# Patient Record
Sex: Female | Born: 1960 | Race: Black or African American | Hispanic: No | Marital: Single | Smoking: Current some day smoker
Health system: Southern US, Community
[De-identification: ages and names within clinical notes are randomized; demographics above are authoritative.]

## PROBLEM LIST (undated history)

## (undated) DIAGNOSIS — G8929 Other chronic pain: Secondary | ICD-10-CM

## (undated) DIAGNOSIS — Z8711 Personal history of peptic ulcer disease: Secondary | ICD-10-CM

## (undated) DIAGNOSIS — M545 Low back pain, unspecified: Secondary | ICD-10-CM

## (undated) DIAGNOSIS — K219 Gastro-esophageal reflux disease without esophagitis: Secondary | ICD-10-CM

## (undated) HISTORY — DX: Low back pain, unspecified: M54.50

## (undated) HISTORY — DX: Other chronic pain: G89.29

## (undated) HISTORY — DX: Gastro-esophageal reflux disease without esophagitis: K21.9

## (undated) HISTORY — PX: KNEE ARTHROSCOPY: SUR90

---

## 2020-01-16 ENCOUNTER — Other Ambulatory Visit: Payer: Self-pay

## 2020-01-16 ENCOUNTER — Ambulatory Visit
Admission: RE | Admit: 2020-01-16 | Discharge: 2020-01-16 | Disposition: A | Discharge status: Home / Self Care | Payer: No Typology Code available for payment source | Source: Ambulatory Visit | Attending: Physical Medicine and Rehabilitation | Admitting: Physical Medicine and Rehabilitation

## 2020-01-16 ENCOUNTER — Other Ambulatory Visit: Payer: Self-pay | Admitting: Physical Medicine and Rehabilitation

## 2020-01-16 DIAGNOSIS — M25562 Pain in left knee: Secondary | ICD-10-CM

## 2020-01-16 DIAGNOSIS — M25561 Pain in right knee: Secondary | ICD-10-CM

## 2020-02-11 ENCOUNTER — Other Ambulatory Visit: Payer: Self-pay

## 2020-02-11 ENCOUNTER — Ambulatory Visit (INDEPENDENT_AMBULATORY_CARE_PROVIDER_SITE_OTHER): Payer: Self-pay | Admitting: Orthopaedic Surgery

## 2020-02-11 DIAGNOSIS — G8929 Other chronic pain: Secondary | ICD-10-CM

## 2020-02-11 DIAGNOSIS — M25562 Pain in left knee: Secondary | ICD-10-CM

## 2020-02-11 DIAGNOSIS — M25561 Pain in right knee: Secondary | ICD-10-CM

## 2020-02-11 NOTE — Progress Notes (Signed)
      Office Visit Note   Patient: Tasha Fox           Date of Birth: 03-29-1961           MRN: [id number] Visit Date: 02/11/2020              Requested by: No referring provider defined for this encounter. PCP: Patient, No Pcp Per   Assessment & Plan: Visit Diagnoses:  1. Chronic pain of right knee   2. Chronic pain of left knee     Plan: I am concerned about the mechanical symptoms she is having on the right knee and feeling what ever ligamentous structure is popping across the medial femoral condyle and the posterior medial aspect of her right knee.  Based on my clinical exam a MRI is warranted to rule out a ligamentous disruption or tear of the right knee.  She agrees with this treatment plan.  I will see her back once we have the MRI of her right knee.  Follow-Up Instructions: Return in about 2 weeks (around 02/25/2020).   Orders:  No orders of the defined types were placed in this encounter.  No orders of the defined types were placed in this encounter.     Procedures: No procedures performed   Clinical Data: No additional findings.   Subjective: Chief Complaint  Patient presents with  . Left Knee - Pain  . Right Knee - Pain  The patient comes in today as a referral from Dr. Nickola Major to evaluate and treat bilateral knee pain.  The patient has been having knee pain for years and a lot of popping in her right knee.  She has had steroid injections and PRP treatment.  X-rays have been recently obtained last month of her knees.  She is very thin individual.  She has never had surgery on her knees and never injured them from what she can recall.  She points to her right knee as the main source of her pain and she shows me where there is some type of ligamentous structure popping over the medial femoral condyle and the posterior medial aspect of her knee.    HPI  Review of Systems She currently denies any headache, chest pain, shortness of breath, fever, chills,  nausea, vomiting  Objective: Vital Signs: There were no vitals taken for this visit.  Physical Exam She is alert and orient x3 and in no acute distress Ortho Exam Examination of both knees shows no effusion.  Both knees are aligned anatomically.  Her right knee does show some type of ligamentous or tendinous structure that is popping over the posterior medial aspect of the knee itself.  I can see this when I flex and extend her knee. Specialty Comments:  No specialty comments available.  Imaging: No results found. 3 views of both knees are independently reviewed on the canopy system.  There is patellofemoral arthritic changes but otherwise the medial lateral compartments are well-maintained and the alignment is neutral.  PMFS History: There are no problems to display for this patient.  No past medical history on file.  No family history on file.   Social History   Occupational History  . Not on file  Tobacco Use  . Smoking status: Not on file  Substance and Sexual Activity  . Alcohol use: Not on file  . Drug use: Not on file  . Sexual activity: Not on file

## 2020-02-26 ENCOUNTER — Ambulatory Visit: Payer: Self-pay | Admitting: Orthopaedic Surgery

## 2020-03-06 ENCOUNTER — Other Ambulatory Visit: Payer: Self-pay

## 2020-03-06 ENCOUNTER — Ambulatory Visit
Admission: RE | Admit: 2020-03-06 | Discharge: 2020-03-06 | Disposition: A | Discharge status: Home / Self Care | Payer: Managed Care, Other (non HMO) | Source: Ambulatory Visit | Attending: Orthopaedic Surgery | Admitting: Orthopaedic Surgery

## 2020-03-06 DIAGNOSIS — G8929 Other chronic pain: Secondary | ICD-10-CM

## 2020-03-07 ENCOUNTER — Other Ambulatory Visit: Payer: Self-pay

## 2020-03-25 ENCOUNTER — Ambulatory Visit: Payer: Managed Care, Other (non HMO) | Admitting: Orthopaedic Surgery

## 2020-03-25 ENCOUNTER — Telehealth: Payer: Self-pay

## 2020-03-25 NOTE — Telephone Encounter (Signed)
Can you call her?

## 2020-03-25 NOTE — Telephone Encounter (Signed)
Patient had appt with Dr Magnus Ivan today but was cancelled due to Dr Magnus Ivan not being in office. Patient would like to be called with MRI results.

## 2020-04-01 ENCOUNTER — Encounter: Payer: Self-pay | Admitting: Physician Assistant

## 2020-04-01 ENCOUNTER — Ambulatory Visit (INDEPENDENT_AMBULATORY_CARE_PROVIDER_SITE_OTHER): Payer: Managed Care, Other (non HMO) | Admitting: Physician Assistant

## 2020-04-01 ENCOUNTER — Other Ambulatory Visit: Payer: Self-pay

## 2020-04-01 DIAGNOSIS — M67461 Ganglion, right knee: Secondary | ICD-10-CM

## 2020-04-01 NOTE — Progress Notes (Signed)
HPI: Mrs. Durkin returns today to go over the MRI of her right knee.  She continues to have pain in the right knee mostly the medial aspect of the knee.  She continues to have popping over the medial aspect of the knee near the femoral condyle. MRI images are reviewed with the patient.  MRI dated 03/08/2020 showed no meniscal or ligamentous injury.  Tricompartmental mild arthritic changes.  Large ganglion cyst originating post anterior to the PCL tibial attachment extending medially to the posterior joint line.  The ganglion cyst was measured at 2.4 x 4.3 x 3.0 cm.  Physical exam: Right knee: Good range of motion of the knee.  Tenderness along medial joint line.  Slight fullness right knee over the medial joint line compared to the medial joint line of the left knee.    Impression: Ganglion cyst right knee Mild tricompartmental arthritic changes  Plan: Due to the fact the patient's failed conservative treatment is having pain along the medial joint line which is consistent with the MRI findings of a large ganglion cyst recommend right knee arthroscopy with debridement and open excision of ganglion cyst.  Questions were encouraged and answered at length by Dr. Magnus Ivan myself.  Risk benefits surgery discussed.  Postop protocol discussed with patient.  She will follow-up with [place] 1 week postop.

## 2020-04-10 ENCOUNTER — Telehealth: Payer: Self-pay | Admitting: Orthopaedic Surgery

## 2020-04-10 NOTE — Telephone Encounter (Signed)
Tasha Fox with [employer] called stating the pt is waiting for approval on two cyst removal surgeries and one has been approved but the other requires a peer to peer with [employer]. The case number for [employer] is: [id number]  [phone] Opt. 4  Tasha Fox would like for [place] to notify the pt when everything has been approved.  [facility] CB# [phone]

## 2020-04-10 NOTE — Telephone Encounter (Signed)
Spoke with patient and rescheduled surgery

## 2020-04-10 NOTE — Telephone Encounter (Signed)
Please advise 

## 2020-04-14 NOTE — Telephone Encounter (Signed)
See message from Dr. Magnus Ivan.

## 2020-04-14 NOTE — Telephone Encounter (Signed)
I did the peer-to-peer review. Approved.  CPT code 17510 for scope and [phone] for cyst excision. Approval code# [id number]

## 2020-04-23 ENCOUNTER — Inpatient Hospital Stay: Payer: Managed Care, Other (non HMO) | Admitting: Physician Assistant

## 2020-04-30 ENCOUNTER — Other Ambulatory Visit: Payer: Self-pay | Admitting: Orthopaedic Surgery

## 2020-04-30 DIAGNOSIS — M67461 Ganglion, right knee: Secondary | ICD-10-CM

## 2020-04-30 DIAGNOSIS — M94261 Chondromalacia, right knee: Secondary | ICD-10-CM

## 2020-04-30 MED ORDER — HYDROCODONE-ACETAMINOPHEN 5-325 MG PO TABS: 1.0000 | ORAL_TABLET | Freq: Four times a day (QID) | ORAL | 0 refills | Status: DC | PRN

## 2020-05-07 ENCOUNTER — Encounter: Payer: Self-pay | Admitting: Orthopaedic Surgery

## 2020-05-07 ENCOUNTER — Ambulatory Visit (INDEPENDENT_AMBULATORY_CARE_PROVIDER_SITE_OTHER): Payer: Managed Care, Other (non HMO) | Admitting: Orthopaedic Surgery

## 2020-05-07 DIAGNOSIS — Z9889 Other specified postprocedural states: Secondary | ICD-10-CM

## 2020-05-07 DIAGNOSIS — M67461 Ganglion, right knee: Secondary | ICD-10-CM

## 2020-05-07 DIAGNOSIS — M1711 Unilateral primary osteoarthritis, right knee: Secondary | ICD-10-CM | POA: Insufficient documentation

## 2020-05-07 MED ORDER — OXYCODONE HCL 5 MG PO TABS: 5.0000 mg | ORAL_TABLET | Freq: Four times a day (QID) | ORAL | 0 refills | Status: DC | PRN

## 2020-05-07 NOTE — Progress Notes (Signed)
The patient is 1 week out from a right knee arthroscopy with debridement as well as excision of a parameniscal cyst and ganglion cyst of the posterior-medial aspect of the right knee.  She is having a lot of pain from the surgery.  At the time of surgery I showed her the arthroscopy pictures and showed him again today.  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  The meniscus were intact and the ACL was intact.  There was a large cyst on the posterior medial aspect of the knee that we were able to remove through an open excision and close on the knee joint.  On exam I did remove all the sutures in place Steri-Strips.  There is not a large knee joint effusion.  Her calf is soft.  She did report some shortness of breath recently but she is breathing well today in the office.  She is a candidate for hyaluronic acid for this knee.  I will send in normal pain medicine prescription and will see her back in hopefully a month to hopefully place hyaluronic acid into the left knee to treat the pain from her osteoarthritis.

## 2020-05-08 ENCOUNTER — Telehealth: Payer: Self-pay

## 2020-05-08 NOTE — Telephone Encounter (Signed)
Submitted VOB, Monovisc, right knee. 

## 2020-05-08 NOTE — Telephone Encounter (Signed)
Right knee gel injection  

## 2020-05-08 NOTE — Telephone Encounter (Signed)
Noted  

## 2020-05-11 ENCOUNTER — Telehealth: Payer: Self-pay

## 2020-05-11 NOTE — Telephone Encounter (Signed)
PA required for Monovisc, right knee. Faxed completed PA form to [employer] at [phone]. 

## 2020-05-13 ENCOUNTER — Telehealth: Payer: Self-pay

## 2020-05-13 NOTE — Telephone Encounter (Signed)
Patient is aware that she is approved for gel injection.  Approved, Monovisc, right knee. Buy & Bill Patient will be responsible for 20% OOP. No Co-pay PA required PA Approval# [id number] Valid 05/11/2020- 06/01/2020  Appt. 05/28/2020

## 2020-05-28 ENCOUNTER — Encounter: Payer: Self-pay | Admitting: Orthopaedic Surgery

## 2020-05-28 ENCOUNTER — Ambulatory Visit (INDEPENDENT_AMBULATORY_CARE_PROVIDER_SITE_OTHER): Payer: Managed Care, Other (non HMO) | Admitting: Orthopaedic Surgery

## 2020-05-28 DIAGNOSIS — G8929 Other chronic pain: Secondary | ICD-10-CM

## 2020-05-28 DIAGNOSIS — M25562 Pain in left knee: Secondary | ICD-10-CM | POA: Diagnosis not present

## 2020-05-28 DIAGNOSIS — M1711 Unilateral primary osteoarthritis, right knee: Secondary | ICD-10-CM | POA: Diagnosis not present

## 2020-05-28 DIAGNOSIS — M25561 Pain in right knee: Secondary | ICD-10-CM

## 2020-05-28 MED ORDER — LIDOCAINE HCL 1 % IJ SOLN
3.0000 mL | INTRAMUSCULAR | Status: AC | PRN
  Administered 2020-05-28: 3 mL

## 2020-05-28 MED ORDER — HYALURONAN 88 MG/4ML IX SOSY
88.0000 mg | PREFILLED_SYRINGE | INTRA_ARTICULAR | Status: AC | PRN
  Administered 2020-05-28: 88 mg via INTRA_ARTICULAR

## 2020-05-28 MED ORDER — METHYLPREDNISOLONE ACETATE 40 MG/ML IJ SUSP
40.0000 mg | INTRAMUSCULAR | Status: AC | PRN
  Administered 2020-05-28: 40 mg via INTRA_ARTICULAR

## 2020-05-28 NOTE — Progress Notes (Signed)
   Procedure Note  Patient: Tasha Fox             Date of Birth: 1961-08-04           MRN: [id number]             Visit Date: 05/28/2020  Procedures: Visit Diagnoses:  1. Chronic pain of right knee   2. Unilateral primary osteoarthritis, right knee   3. Chronic pain of left knee     Large Joint Inj: L knee on 05/28/2020 2:04 PM Indications: diagnostic evaluation and pain Details: 22 G 1.5 in needle, superolateral approach  Arthrogram: No  Medications: 3 mL lidocaine 1 %; 40 mg methylPREDNISolone acetate 40 MG/ML Outcome: tolerated well, no immediate complications Procedure, treatment alternatives, risks and benefits explained, specific risks discussed. Consent was given by the patient. Immediately prior to procedure a time out was called to verify the correct patient, procedure, equipment, support staff and site/side marked as required. Patient was prepped and draped in the usual sterile fashion.   Large Joint Inj: R knee on 05/28/2020 2:05 PM Indications: diagnostic evaluation and pain Details: 22 G 1.5 in needle, superolateral approach  Arthrogram: No  Medications: 3 mL lidocaine 1 %; 88 mg Hyaluronan 88 MG/4ML Outcome: tolerated well, no immediate complications Procedure, treatment alternatives, risks and benefits explained, specific risks discussed. Consent was given by the patient. Immediately prior to procedure a time out was called to verify the correct patient, procedure, equipment, support staff and site/side marked as required. Patient was prepped and draped in the usual sterile fashion.    The patient is 1 month out from a right knee arthroscopy.  She has been having now some left knee pain due to compensation for her right knee.  The arthroscopy pictures were reviewed again today of her right knee and it does show significant chondromalacia of the patellofemoral joint and the medial compartment of the knee.  This is at least grade 3 to centimeters grade IV  chondromalacia.  Today she is scheduled for a Monovisc injection with hyaluronic acid into the right knee.  This is to hopefully temporize her symptoms.  The only other option is knee replacement surgery.  She does wish to have a steroid injection in her left knee today.  I explained the rationale behind the steroid injection for the left knee as well as the hyaluronic acid for the right knee.  She tolerated both of these well.  All questions and concerns were answered addressed.  We will keep her out of work the next 2 weeks and allow her to return to work September 20.  I would like to see her back in 8 weeks to see how she is doing overall.

## 2020-06-04 ENCOUNTER — Ambulatory Visit: Payer: Managed Care, Other (non HMO) | Admitting: Orthopaedic Surgery

## 2020-07-28 ENCOUNTER — Ambulatory Visit: Payer: Managed Care, Other (non HMO) | Admitting: Orthopaedic Surgery

## 2020-07-30 ENCOUNTER — Ambulatory Visit (INDEPENDENT_AMBULATORY_CARE_PROVIDER_SITE_OTHER): Payer: Managed Care, Other (non HMO) | Admitting: Orthopaedic Surgery

## 2020-07-30 ENCOUNTER — Encounter: Payer: Self-pay | Admitting: Orthopaedic Surgery

## 2020-07-30 DIAGNOSIS — M25561 Pain in right knee: Secondary | ICD-10-CM | POA: Diagnosis not present

## 2020-07-30 DIAGNOSIS — M25562 Pain in left knee: Secondary | ICD-10-CM

## 2020-07-30 DIAGNOSIS — G8929 Other chronic pain: Secondary | ICD-10-CM

## 2020-07-30 DIAGNOSIS — M1711 Unilateral primary osteoarthritis, right knee: Secondary | ICD-10-CM | POA: Diagnosis not present

## 2020-07-30 NOTE — Progress Notes (Signed)
The patient comes in today reporting much improvement in terms of her right knee pain.  This is a knee that we performed an arthroscopic intervention on several months ago and a cyst excision.  8 weeks ago we did place hyaluronic acid in the right knee and steroid in her left knee.  She says right now she is doing better overall and has some tightness but she is not taking medication for pain and she feels like she is made good progress and is doing great.  Examination of her right knee shows no effusion.  Range of motion is full.  There is only some mild medial joint line tenderness which is minimal.  The knee is ligamentously stable.  The left knee exam is normal.  At this point she can follow-up as needed since she is doing well.  We can always reinject her knees at a later date if they become painful again or problematic to her.  Have also recommended occasional Aleve or Tylenol arthritis and even Voltaren gel to try.  All questions and concerns were answered and addressed.

## 2021-01-29 ENCOUNTER — Other Ambulatory Visit (HOSPITAL_BASED_OUTPATIENT_CLINIC_OR_DEPARTMENT_OTHER): Payer: Self-pay

## 2021-01-29 ENCOUNTER — Encounter (HOSPITAL_BASED_OUTPATIENT_CLINIC_OR_DEPARTMENT_OTHER): Payer: Self-pay | Admitting: Emergency Medicine

## 2021-01-29 ENCOUNTER — Emergency Department (HOSPITAL_BASED_OUTPATIENT_CLINIC_OR_DEPARTMENT_OTHER)
Admission: EM | Admit: 2021-01-29 | Discharge: 2021-01-29 | Disposition: A | Discharge status: Home / Self Care | Payer: Managed Care, Other (non HMO) | Attending: Emergency Medicine | Admitting: Emergency Medicine

## 2021-01-29 ENCOUNTER — Other Ambulatory Visit: Payer: Self-pay

## 2021-01-29 ENCOUNTER — Emergency Department (HOSPITAL_BASED_OUTPATIENT_CLINIC_OR_DEPARTMENT_OTHER): Payer: Managed Care, Other (non HMO)

## 2021-01-29 DIAGNOSIS — Z87891 Personal history of nicotine dependence: Secondary | ICD-10-CM | POA: Insufficient documentation

## 2021-01-29 DIAGNOSIS — K2900 Acute gastritis without bleeding: Secondary | ICD-10-CM | POA: Diagnosis not present

## 2021-01-29 DIAGNOSIS — R112 Nausea with vomiting, unspecified: Secondary | ICD-10-CM

## 2021-01-29 DIAGNOSIS — R197 Diarrhea, unspecified: Secondary | ICD-10-CM | POA: Insufficient documentation

## 2021-01-29 HISTORY — DX: Personal history of peptic ulcer disease: Z87.11

## 2021-01-29 LAB — CBC WITH DIFFERENTIAL/PLATELET
Abs Immature Granulocytes: 0.04 10*3/uL (ref 0.00–0.07)
Basophils Absolute: 0 10*3/uL (ref 0.0–0.1)
Basophils Relative: 0 %
Eosinophils Absolute: 0.1 10*3/uL (ref 0.0–0.5)
Eosinophils Relative: 1 %
HCT: 41.4 % (ref 36.0–46.0)
Hemoglobin: 13.9 g/dL (ref 12.0–15.0)
Immature Granulocytes: 0 %
Lymphocytes Relative: 23 %
Lymphs Abs: 2.7 10*3/uL (ref 0.7–4.0)
MCH: 31.2 pg (ref 26.0–34.0)
MCHC: 33.6 g/dL (ref 30.0–36.0)
MCV: 92.8 fL (ref 80.0–100.0)
Monocytes Absolute: 0.9 10*3/uL (ref 0.1–1.0)
Monocytes Relative: 8 %
Neutro Abs: 7.7 10*3/uL (ref 1.7–7.7)
Neutrophils Relative %: 68 %
Platelets: 331 10*3/uL (ref 150–400)
RBC: 4.46 MIL/uL (ref 3.87–5.11)
RDW: 11.9 % (ref 11.5–15.5)
WBC: 11.5 10*3/uL — ABNORMAL HIGH (ref 4.0–10.5)
nRBC: 0 % (ref 0.0–0.2)

## 2021-01-29 LAB — COMPREHENSIVE METABOLIC PANEL
ALT: 7 U/L (ref 0–44)
AST: 12 U/L — ABNORMAL LOW (ref 15–41)
Albumin: 4.4 g/dL (ref 3.5–5.0)
Alkaline Phosphatase: 86 U/L (ref 38–126)
Anion gap: 12 (ref 5–15)
BUN: 12 mg/dL (ref 6–20)
CO2: 24 mmol/L (ref 22–32)
Calcium: 9.8 mg/dL (ref 8.9–10.3)
Chloride: 100 mmol/L (ref 98–111)
Creatinine, Ser: 0.73 mg/dL (ref 0.44–1.00)
GFR, Estimated: >60 mL/min (ref >60)
Glucose, Bld: 92 mg/dL (ref 70–99)
Potassium: 3.9 mmol/L (ref 3.5–5.1)
Sodium: 136 mmol/L (ref 135–145)
Total Bilirubin: 0.8 mg/dL (ref 0.3–1.2)
Total Protein: 7.7 g/dL (ref 6.5–8.1)

## 2021-01-29 LAB — PROTIME-INR
INR: 1 (ref 0.8–1.2)
Prothrombin Time: 13.2 seconds (ref 11.4–15.2)

## 2021-01-29 LAB — LIPASE, BLOOD: Lipase: 22 U/L (ref 11–51)

## 2021-01-29 MED ORDER — PANTOPRAZOLE SODIUM 20 MG PO TBEC
20.0000 mg | DELAYED_RELEASE_TABLET | Freq: Every day | ORAL | 1 refills | Status: DC
  Filled 2021-01-29: qty 30, 30d supply, fill #0

## 2021-01-29 MED ORDER — ONDANSETRON HCL 4 MG/2ML IJ SOLN
4.0000 mg | Freq: Once | INTRAMUSCULAR | Status: AC
  Administered 2021-01-29: 4 mg via INTRAVENOUS
  Filled 2021-01-29: qty 2

## 2021-01-29 MED ORDER — ONDANSETRON 4 MG PO TBDP
4.0000 mg | ORAL_TABLET | Freq: Three times a day (TID) | ORAL | 0 refills | Status: DC | PRN
  Filled 2021-01-29: qty 20, 7d supply, fill #0

## 2021-01-29 MED ORDER — PANTOPRAZOLE SODIUM 40 MG IV SOLR
40.0000 mg | Freq: Once | INTRAVENOUS | Status: AC
  Administered 2021-01-29: 40 mg via INTRAVENOUS
  Filled 2021-01-29: qty 40

## 2021-01-29 MED ORDER — IOHEXOL 350 MG/ML SOLN
75.0000 mL | Freq: Once | INTRAVENOUS | Status: AC | PRN
  Administered 2021-01-29: 75 mL via INTRAVENOUS

## 2021-01-29 MED ORDER — MORPHINE SULFATE (PF) 4 MG/ML IV SOLN
4.0000 mg | Freq: Once | INTRAVENOUS | Status: AC
Start: 2021-01-29 — End: 2021-01-29
  Administered 2021-01-29: 4 mg via INTRAVENOUS
  Filled 2021-01-29: qty 1

## 2021-01-29 MED ORDER — SODIUM CHLORIDE 0.9 % IV BOLUS
500.0000 mL | Freq: Once | INTRAVENOUS | Status: AC
Start: 2021-01-29 — End: 2021-01-29
  Administered 2021-01-29: 500 mL via INTRAVENOUS

## 2021-01-29 NOTE — Discharge Instructions (Addendum)
You are seen in the emergency department for nausea vomiting diarrhea and abdominal pain.  You had lab work and a CAT scan.  Your symptoms are likely due to some gastritis which is irritation of the stomach.  We are prescribing some acid medication and nausea medication.  Please start with a clear liquid diet and advance as tolerated.  Follow-up with your doctor.  Return to the emergency department for any worsening or concerning symptoms.

## 2021-01-29 NOTE — ED Notes (Signed)
Patient verbalizes understanding of discharge instructions. Opportunity for questioning and answers were provided. Armband removed by staff, pt discharged from ED. Pt. ambulatory and discharged home.  

## 2021-01-29 NOTE — ED Triage Notes (Signed)
Patient reports to the ER for abdominal. Patient reports the pain started on Monday. Patient reports emesis with blood. Endorses diarrhea.

## 2021-01-29 NOTE — ED Provider Notes (Signed)
[facility] EMERGENCY DEPT Provider Note   CSN: [id number] Arrival date & time: 01/29/21  1058     History No chief complaint on file.   Tasha Fox is a 60 y.o. female.  She is here with a complaint of 4 days of upper abdominal pain, nausea vomiting diarrhea.  She said the vomit and diarrhea is brown-colored.  No fevers or chills.  She thinks she had a similar episode a year ago.  She thinks she also has a remote history of stomach ulcers but does not take any medication.  Quit smoking 5 months ago and infrequent alcohol.  The history is provided by the patient and a relative.  Abdominal Pain Pain location:  Epigastric Pain quality: aching   Pain radiates to:  Does not radiate Pain severity:  Moderate Onset quality:  Gradual Duration:  4 days Timing:  Constant Progression:  Unchanged Chronicity:  New Context: not alcohol use and not trauma   Relieved by:  Nothing Worsened by:  Nothing Ineffective treatments:  None tried Associated symptoms: diarrhea, nausea and vomiting   Associated symptoms: no chest pain, no constipation, no cough, no dysuria, no fever, no shortness of breath and no sore throat        Past Medical History:  Diagnosis Date  . History of stomach ulcers     Patient Active Problem List   Diagnosis Date Noted  . Unilateral primary osteoarthritis, right knee 05/07/2020    Past Surgical History:  Procedure Laterality Date  . KNEE ARTHROSCOPY Right      OB History    Gravida      Para      Term      Preterm      AB      Living  2     SAB      IAB      Ectopic      Multiple      Live Births              History reviewed. No pertinent family history.  Social History   Tobacco Use  . Smoking status: Former Smoker    Packs/day: 2.00    Years: 35.00    Pack years: 70.00    Types: Cigarettes  . Smokeless tobacco: Never Used  Vaping Use  . Vaping Use: Never used  Substance Use Topics  . Alcohol use: Not  Currently  . Drug use: Not Currently    Types: Marijuana    Home Medications Prior to Admission medications   Medication Sig Start Date End Date Taking? Authorizing Provider  clindamycin (CLEOCIN T) 1 % external solution clindamycin phosphate 1 % topical solution  APPLY ONE APPLICATION TOPICALLY TWICE DAILY. 07/09/18   [provider]  diazepam (VALIUM) 5 MG tablet diazepam 5 mg tablet  TAKE 1 TO 2 TABLETS BY MOUTH 90 MINUTES BEFORE PROCEDURE MAY REPEAT 2 DOSES 30 MINUTES LATER BUT BEFORE PROCEDURE    [provider]  doxepin (SINEQUAN) 10 MG capsule doxepin 10 mg capsule  TAKE 1 TO 2 CAPSULES BY MOUTH AT BEDTIME    [provider]  doxycycline (VIBRAMYCIN) 100 MG capsule doxycycline hyclate 100 mg capsule  TAKE 1 CAPSULE BY MOUTH TWICE DAILY TAKE WITH WATER 1 HOUR AFTER A MEAL 06/19/18   [provider]  fluconazole (DIFLUCAN) 150 MG tablet fluconazole 150 mg tablet  TAKE 1 TABLET BY MOUTH ONCE A WEEK AS NEEDED FOR YEAST INFECTION    [provider]  gabapentin (NEURONTIN) 300 MG capsule gabapentin 300 mg capsule  TAKE 1 CAPSULE BY MOUTH THREE TIMES DAILY AS DIRECTED FOR 30 DAYS 07/23/18   [provider]  HYDROcodone-acetaminophen (NORCO/VICODIN) 5-325 MG tablet Take 1-2 tablets by mouth every 6 (six) hours as needed for moderate pain. 04/30/20   Kathryne Hitch, MD  ibuprofen (ADVIL) 800 MG tablet ibuprofen 800 mg tablet  TAKE 1 TABLET BY MOUTH EVERY 8 HOURS AS NEEDED FOR PAIN    [provider]  ketoconazole (NIZORAL) 2 % shampoo ketoconazole 2 % shampoo  APPLY 1 APPLICATION TOPICAL 3 TIMES A WEEK 05/18/18   [provider]  ketorolac (TORADOL) 10 MG tablet ketorolac 10 mg tablet  TAKE 1 TABLET BY MOUTH 4 TIMES DAILY    [provider]  meloxicam (MOBIC) 15 MG tablet meloxicam 15 mg tablet  TAKE 1 TABLET BY MOUTH ONCE DAILY AFTER A MEAL FOR 30 DAYS    [provider]  mirtazapine (REMERON)  7.5 MG tablet mirtazapine 7.5 mg tablet  TAKE 1 TABLET BY MOUTH AT BEDTIME TO HELP WITH APPETITE. 07/07/18   [provider]  Omega-3 Fatty Acids (FISH OIL) 1000 MG CAPS Take by mouth.    [provider]  oxyCODONE (ROXICODONE) 5 MG immediate release tablet Take 1 tablet (5 mg total) by mouth every 6 (six) hours as needed for severe pain. 05/07/20   Kathryne Hitch, MD    Allergies    Sulfa antibiotics and Sulfamethoxazole-trimethoprim  Review of Systems   Review of Systems  Constitutional: Negative for fever.  HENT: Negative for sore throat.   Eyes: Negative for visual disturbance.  Respiratory: Negative for cough and shortness of breath.   Cardiovascular: Negative for chest pain.  Gastrointestinal: Positive for abdominal pain, diarrhea, nausea and vomiting. Negative for constipation.  Genitourinary: Negative for dysuria.  Musculoskeletal: Negative for neck pain.  Skin: Negative for rash.  Neurological: Negative for headaches.    Physical Exam Updated Vital Signs BP (!) 163/84   Pulse 80   Temp 98.5 F (36.9 C) (Oral)   Resp 16   LMP  (LMP Unknown)   SpO2 100%   Physical Exam Vitals and nursing note reviewed.  Constitutional:      General: She is not in acute distress.    Appearance: Normal appearance. She is well-developed.  HENT:     Head: Normocephalic and atraumatic.  Eyes:     Conjunctiva/sclera: Conjunctivae normal.  Cardiovascular:     Rate and Rhythm: Normal rate and regular rhythm.     Heart sounds: No murmur heard.   Pulmonary:     Effort: Pulmonary effort is normal. No respiratory distress.     Breath sounds: Normal breath sounds.  Abdominal:     Palpations: Abdomen is soft.     Tenderness: There is no abdominal tenderness. There is no guarding or rebound.  Musculoskeletal:        General: No deformity or signs of injury. Normal range of motion.     Cervical back: Neck supple.  Skin:    General: Skin is warm and dry.   Neurological:     General: No focal deficit present.     Mental Status: She is alert.     ED Results / Procedures / Treatments   Labs (all labs ordered are listed, but only abnormal results are displayed) Labs Reviewed  COMPREHENSIVE METABOLIC PANEL - Abnormal; Notable for the following components:      Result Value   AST  12 (*)    All other components within normal limits  CBC WITH DIFFERENTIAL/PLATELET - Abnormal; Notable for the following components:   WBC 11.5 (*)    All other components within normal limits  LIPASE, BLOOD  PROTIME-INR  URINALYSIS, ROUTINE W REFLEX MICROSCOPIC  OCCULT BLOOD X 1 CARD TO LAB, STOOL    EKG EKG Interpretation  Date/Time:  Friday Jan 29 2021 11:13:47 EDT Ventricular Rate:  78 PR Interval:  141 QRS Duration: 82 QT Interval:  371 QTC Calculation: 423 R Axis:   28 Text Interpretation: Sinus rhythm RSR' in V1 or V2, right VCD or RVH No old tracing to compare Confirmed by Meridee ScoreButler, Jalena Vanderlinden [phone]([id number]) on 01/29/2021 11:18:30 AM   Radiology CT Abdomen Pelvis W Contrast  Result Date: 01/29/2021 CLINICAL DATA:  Abdominal pain. EXAM: CT ABDOMEN AND PELVIS WITH CONTRAST TECHNIQUE: Multidetector CT imaging of the abdomen and pelvis was performed using the standard protocol following bolus administration of intravenous contrast. CONTRAST:  75mL OMNIPAQUE IOHEXOL 350 MG/ML SOLN COMPARISON:  None. FINDINGS: Lower chest: No acute abnormality. Hepatobiliary: 1.0 cm simple cyst in the central hepatic dome. Punctate calcified granuloma along the gallbladder fossa. The gallbladder is unremarkable. No biliary dilatation. Pancreas: Unremarkable. No pancreatic ductal dilatation or surrounding inflammatory changes. Spleen: Normal in size without focal abnormality. Adrenals/Urinary Tract: Adrenal glands are unremarkable. Kidneys are normal, without renal calculi, focal lesion, or hydronephrosis. Bladder is unremarkable. Stomach/Bowel: Asymmetric wall thickening of the gastric  antrum. Appendix appears normal. No evidence of bowel wall thickening, distention, or inflammatory changes. Vascular/Lymphatic: Aortic atherosclerosis. No enlarged abdominal or pelvic lymph nodes. Reproductive: 2.7 cm uterine fibroid.  No adnexal mass. Other: No abdominal wall hernia or abnormality. No abdominopelvic ascites. No pneumoperitoneum. Musculoskeletal: No acute or significant osseous findings. IMPRESSION: 1. Asymmetric wall thickening of the gastric antrum may reflect gastritis. 2. Fibroid uterus. 3. Aortic Atherosclerosis (ICD10-I70.0). Electronically Signed   By: Obie DredgeWilliam T Derry M.D.   On: 01/29/2021 14:49    Procedures Procedures   Medications Ordered in ED Medications  pantoprazole (PROTONIX) injection 40 mg (has no administration in time range)  ondansetron (ZOFRAN) injection 4 mg (has no administration in time range)  sodium chloride 0.9 % bolus 500 mL (has no administration in time range)  morphine 4 MG/ML injection 4 mg (has no administration in time range)    ED Course  I have reviewed the triage vital signs and the nursing notes.  Pertinent labs & imaging results that were available during my care of the patient were reviewed by me and considered in my medical decision making (see chart for details).  Clinical Course as of 01/29/21 1740  Fri Jan 29, 2021  1134 Rectal exam done with nurse Marylene LandAngela as chaperone.  Normal tone no masses no stool in vault.  Sample sent to lab for guaiac. [MB]  1215 The lab says they are rejecting her guaiac card because there was nothing on it. [MB]  1454 Patient states she is hungry.  CT showing some gastric wall thickening.  No other acute findings.  Will prescribe her Zofran and PPI.  Return instructions discussed [MB]    Clinical Course User Index [MB] Terrilee FilesButler, Camari Wisham C, MD   MDM Rules/Calculators/A&P                         This patient complains of upper abdominal pain nausea vomiting diarrhea; this involves an extensive number of  treatment Options and is a complaint that carries with  it a high risk of complications and Morbidity. The differential includes peptic ulcer disease, gastritis, reflux, cholelithiasis, cholecystitis, obstruction, perforation, gastroenteritis  I ordered, reviewed and interpreted labs, which included CBC with mildly elevated white count, normal hemoglobin, chemistries LFTs and lipase normal I ordered medication IV PPI, IV fluids and nausea medication I ordered imaging studies which included CT abdomen and pelvis and I independently    visualized and interpreted imaging which showed gastric wall thickening consideration for gastritis Additional history obtained from patient's daughter Previous records obtained and reviewed in epic, no recent admissions  After the interventions stated above, I reevaluated the patient and found patient symptoms to be improved and she is asking to eat and drink.  Reviewed work-up with her and she is comfortable plan for discharge home on PPI and nausea medication.  Return instructions discussed   Final Clinical Impression(s) / ED Diagnoses Final diagnoses:  Nausea vomiting and diarrhea  Acute gastritis, presence of bleeding unspecified, unspecified gastritis type    Rx / DC Orders ED Discharge Orders         Ordered    pantoprazole (PROTONIX) 20 MG tablet  Daily        01/29/21 1456    ondansetron (ZOFRAN-ODT) 4 MG disintegrating tablet  Every 8 hours PRN        01/29/21 1456           Terrilee Files, MD 01/29/21 1742

## 2021-08-07 IMAGING — CR DG KNEE AP/LAT W/ SUNRISE*L*
3 series · 3 of 3 positions shown · non-contrast
Comparison: None.

CLINICAL DATA: Chronic left knee pain.

EXAM:
LEFT KNEE 3 VIEWS

[w knee ap left]
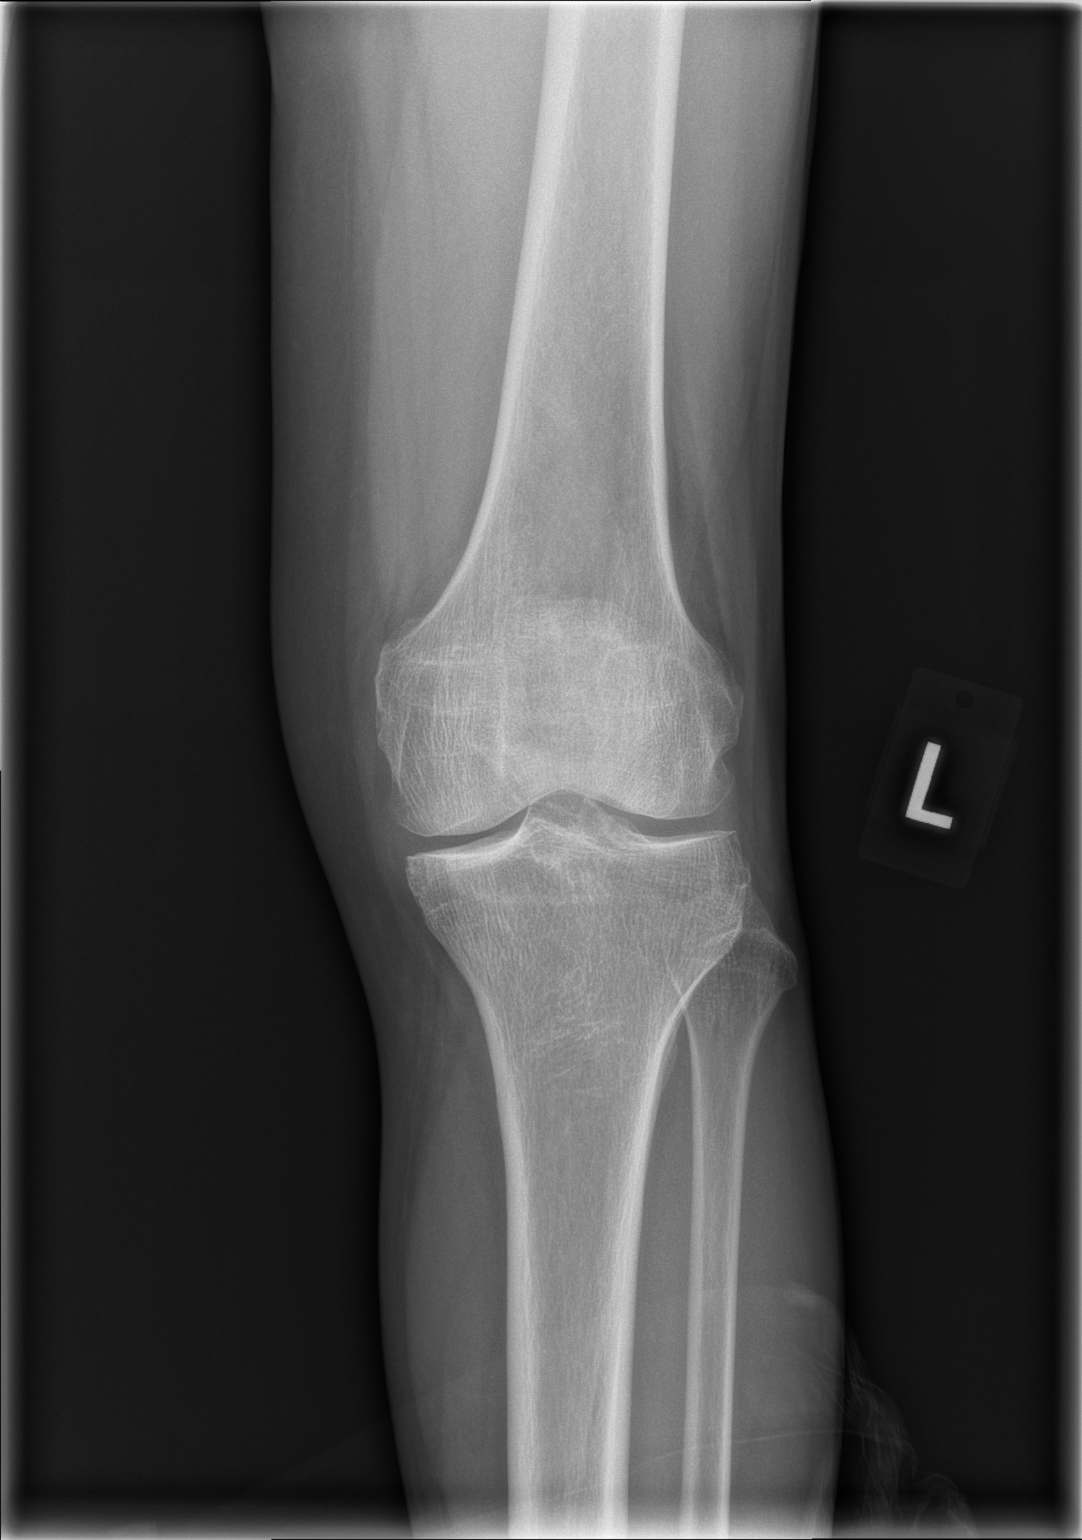

[w knee lat left]
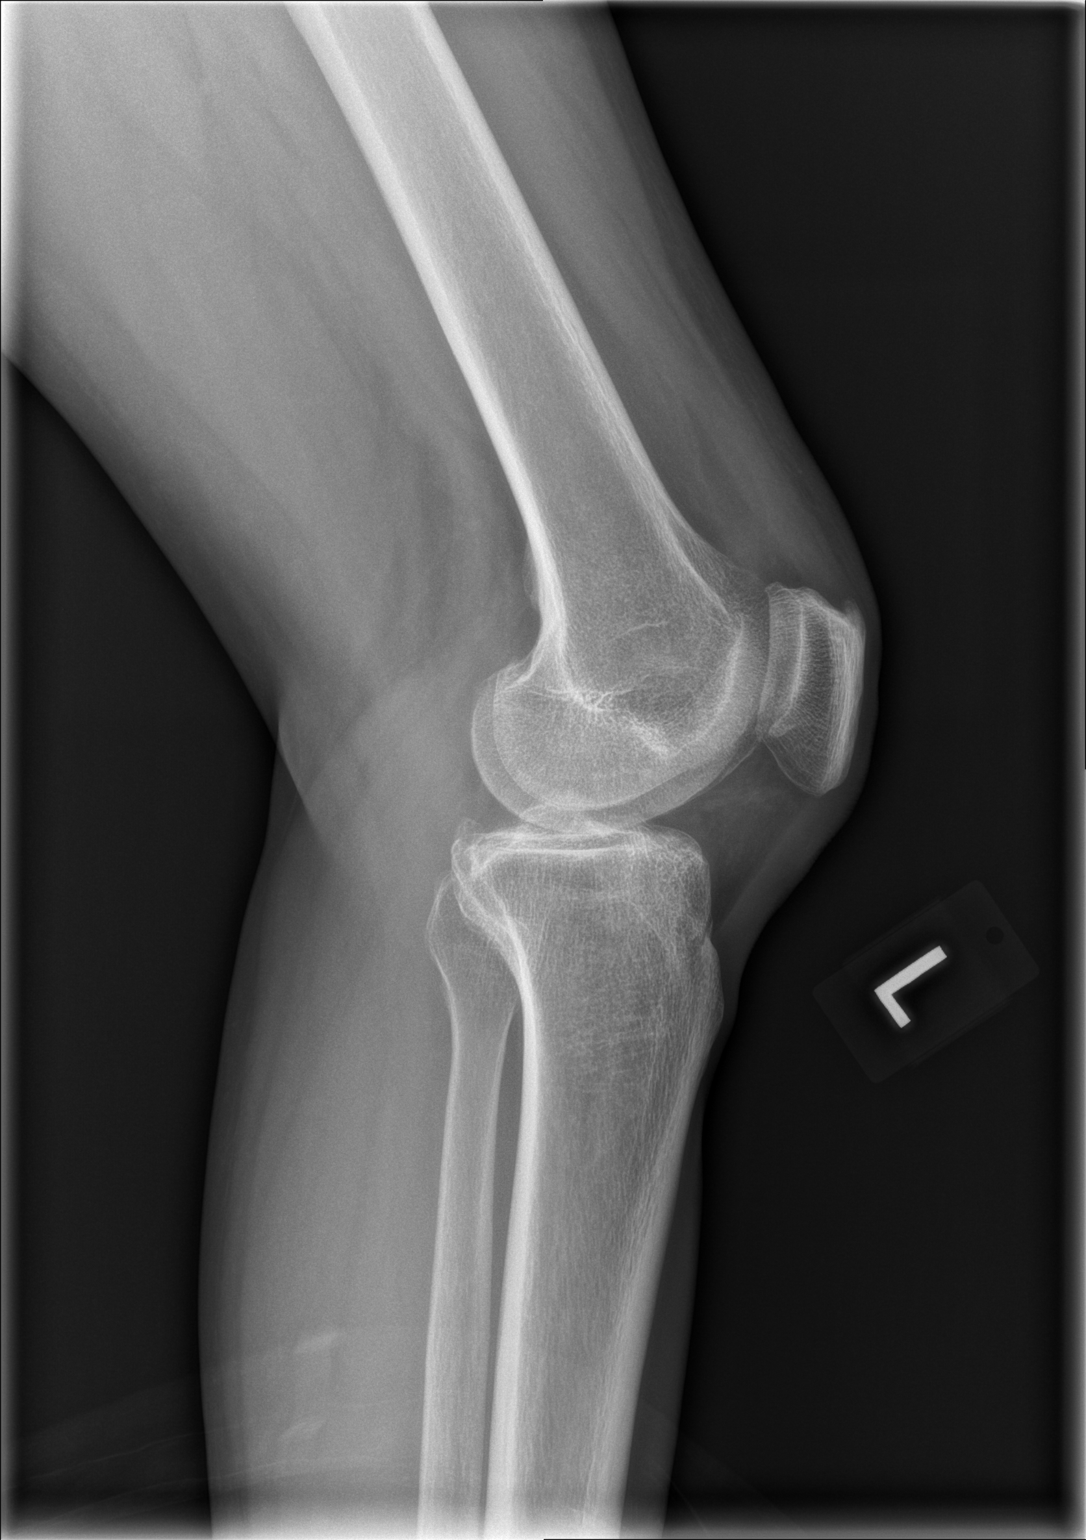

[x knee sunrise left]
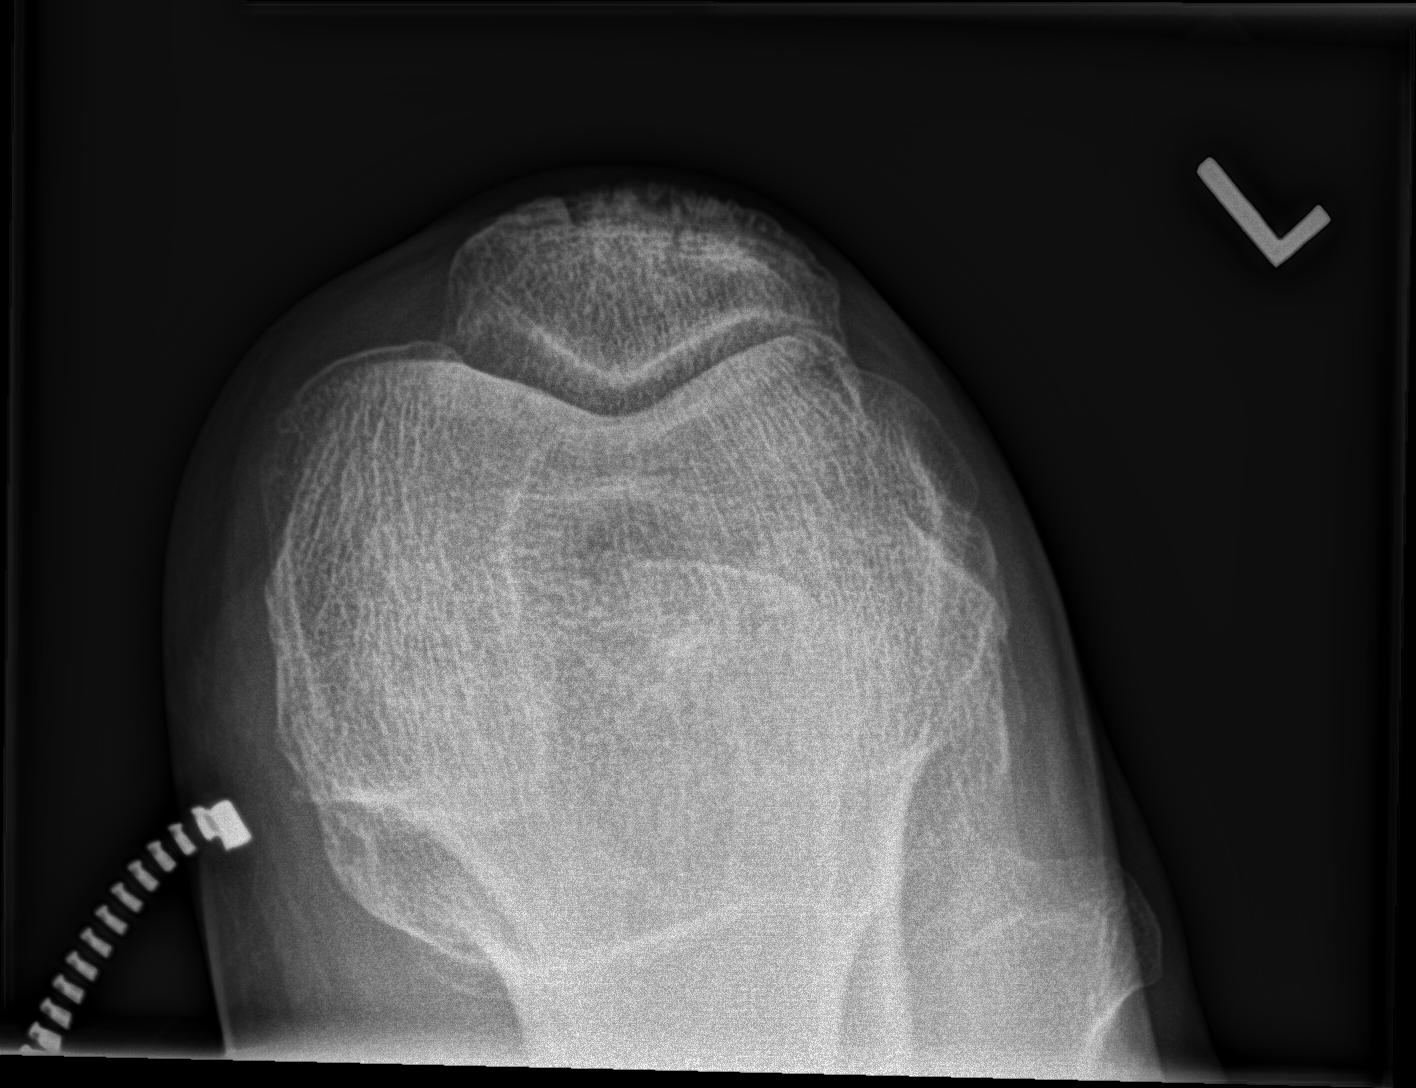

[3 of 3 positions shown; findings below may reference images not displayed]

FINDINGS: No acute fracture or dislocation. No joint effusion. Mild medial and
patellofemoral compartment joint space narrowing. Bone
mineralization is normal. Soft tissues are unremarkable.
IMPRESSION: 1. Mild medial and patellofemoral compartment osteoarthritis.

## 2021-11-08 ENCOUNTER — Telehealth: Payer: Self-pay | Admitting: Orthopaedic Surgery

## 2021-11-08 NOTE — Telephone Encounter (Signed)
Received medical records release form from patient  

## 2022-02-09 ENCOUNTER — Ambulatory Visit (INDEPENDENT_AMBULATORY_CARE_PROVIDER_SITE_OTHER): Payer: Managed Care, Other (non HMO)

## 2022-02-09 ENCOUNTER — Ambulatory Visit: Payer: Self-pay

## 2022-02-09 ENCOUNTER — Ambulatory Visit (INDEPENDENT_AMBULATORY_CARE_PROVIDER_SITE_OTHER): Payer: Managed Care, Other (non HMO) | Admitting: Orthopaedic Surgery

## 2022-02-09 DIAGNOSIS — M25561 Pain in right knee: Secondary | ICD-10-CM

## 2022-02-09 DIAGNOSIS — M25562 Pain in left knee: Secondary | ICD-10-CM

## 2022-02-09 DIAGNOSIS — G8929 Other chronic pain: Secondary | ICD-10-CM | POA: Diagnosis not present

## 2022-02-09 MED ORDER — LIDOCAINE HCL 1 % IJ SOLN
3.0000 mL | INTRAMUSCULAR | Status: AC | PRN
  Administered 2022-02-09: 3 mL

## 2022-02-09 MED ORDER — NABUMETONE 500 MG PO TABS: 500.0000 mg | ORAL_TABLET | Freq: Two times a day (BID) | ORAL | 1 refills | Status: DC | PRN

## 2022-02-09 MED ORDER — METHYLPREDNISOLONE ACETATE 40 MG/ML IJ SUSP
40.0000 mg | INTRAMUSCULAR | Status: AC | PRN
  Administered 2022-02-09: 40 mg via INTRA_ARTICULAR

## 2022-02-09 NOTE — Progress Notes (Signed)
? ?Office Visit Note ?  ?Patient: Tasha Fox           ?Date of Birth: March 24, 1961           ?MRN: [id number] ?Visit Date: 02/09/2022 ?             ?Requested by: No referring provider defined for this encounter. ?PCP: Patient, No Pcp Per (Inactive) ? ? ?Assessment & Plan: ?Visit Diagnoses:  ?1. Chronic pain of right knee   ?2. Chronic pain of left knee   ? ? ?Plan: I did recommend a steroid injection in both knees today which she agreed to and tolerated well.  I will send in some Relafen as an anti-inflammatory.  She may be a good candidate for hyaluronic acid in the future.  I would like to reevaluate in 4 weeks to see how the steroid injections are done for her. ? ?Follow-Up Instructions: Return in about 4 weeks (around 03/09/2022).  ? ?Orders:  ?Orders Placed This Encounter  ?Procedures  ? Large Joint Inj  ? Large Joint Inj  ? XR Knee 1-2 Views Right  ? XR Knee 1-2 Views Left  ? ?Meds ordered this encounter  ?Medications  ? nabumetone (RELAFEN) 500 MG tablet  ?  Sig: Take 1 tablet (500 mg total) by mouth 2 (two) times daily as needed.  ?  Dispense:  60 tablet  ?  Refill:  1  ? ? ? ? Procedures: ?Large Joint Inj: R knee on 02/09/2022 1:43 PM ?Indications: diagnostic evaluation and pain ?Details: 22 G 1.5 in needle, superolateral approach ? ?Arthrogram: No ? ?Medications: 3 mL lidocaine 1 %; 40 mg methylPREDNISolone acetate 40 MG/ML ?Outcome: tolerated well, no immediate complications ?Procedure, treatment alternatives, risks and benefits explained, specific risks discussed. Consent was given by the patient. Immediately prior to procedure a time out was called to verify the correct patient, procedure, equipment, support staff and site/side marked as required. Patient was prepped and draped in the usual sterile fashion.  ? ? ?Large Joint Inj: L knee on 02/09/2022 1:44 PM ?Indications: diagnostic evaluation and pain ?Details: 22 G 1.5 in needle, superolateral approach ? ?Arthrogram: No ? ?Medications: 3 mL lidocaine  1 %; 40 mg methylPREDNISolone acetate 40 MG/ML ?Outcome: tolerated well, no immediate complications ?Procedure, treatment alternatives, risks and benefits explained, specific risks discussed. Consent was given by the patient. Immediately prior to procedure a time out was called to verify the correct patient, procedure, equipment, support staff and site/side marked as required. Patient was prepped and draped in the usual sterile fashion.  ? ? ? ? ?Clinical Data: ?No additional findings. ? ? ?Subjective: ?Chief Complaint  ?Patient presents with  ? Left Knee - Pain  ? Right Knee - Pain  ?The patient comes in with chronic bilateral knee pain.  She says she has tingling and swelling in her knee giving out on her.  We actually have seen her for her right knee before an MRI of the right knee shows no meniscal tearing.  There is only thinning of the articular cartilage.  She says her left knee may be hurting from decompensation.  Or even overcompensating.  Both knees pop and catch on her.  She is a thin individual.  She says she really needs something for the pain.  She has been taking Advil or Aleve. ? ?HPI ? ?Review of Systems ?She currently denies any headache, chest pain, shortness of breath, fever, chills, nausea, vomiting ? ?Objective: ?Vital Signs: LMP  (LMP Unknown)  ? ?  Physical Exam ?She is alert and orient x3 and in no acute distress ?Ortho Exam ?Both knees have full active and passive motion.  There is no effusion in either knee.  Both knees are ligamentously stable.  Both knees have grinding at the patellofemoral joint with crepitation. ?Specialty Comments:  ?No specialty comments available. ? ?Imaging: ?XR Knee 1-2 Views Left ? ?Result Date: 02/09/2022 ?2 views of the left knee showed no acute findings.  There is patellofemoral narrowing and arthritic changes.  The medial lateral compartments are well-maintained. ? ?XR Knee 1-2 Views Right ? ?Result Date: 02/09/2022 ?2 views of the right knee show no acute  findings.  The medial lateral compartments are well-maintained.  There is narrowing of the patellofemoral joint.  ? ? ?PMFS History: ?Patient Active Problem List  ? Diagnosis Date Noted  ? Unilateral primary osteoarthritis, right knee 05/07/2020  ? ?Past Medical History:  ?Diagnosis Date  ? History of stomach ulcers   ?  ?No family history on file.  ?Past Surgical History:  ?Procedure Laterality Date  ? KNEE ARTHROSCOPY Right   ? ?Social History  ? ?Occupational History  ? Not on file  ?Tobacco Use  ? Smoking status: Former  ?  Packs/day: 2.00  ?  Years: 35.00  ?  Pack years: 70.00  ?  Types: Cigarettes  ? Smokeless tobacco: Never  ?Vaping Use  ? Vaping Use: Never used  ?Substance and Sexual Activity  ? Alcohol use: Not Currently  ? Drug use: Not Currently  ?  Types: Marijuana  ? Sexual activity: Not Currently  ? ? ? ? ? ? ?

## 2022-03-09 ENCOUNTER — Ambulatory Visit (INDEPENDENT_AMBULATORY_CARE_PROVIDER_SITE_OTHER): Payer: Managed Care, Other (non HMO) | Admitting: Orthopaedic Surgery

## 2022-03-09 ENCOUNTER — Encounter: Payer: Self-pay | Admitting: Orthopaedic Surgery

## 2022-03-09 DIAGNOSIS — M25561 Pain in right knee: Secondary | ICD-10-CM

## 2022-03-09 DIAGNOSIS — G8929 Other chronic pain: Secondary | ICD-10-CM

## 2022-03-09 NOTE — Progress Notes (Signed)
The patient is well-known to Tasha Fox.  She has chronic pain in both of her knees and a month ago she had steroid injections placed in both of her knees.  We have MRI of the right knee before that showed just some mild arthritic changes.  She has remote history of arthroscopic surgery on the right knee.  She said the right knee still feels unstable to her and bothersome to her.  The left knee injection did well.  She has never had hyaluronic acid in her knee.  On exam today both knees show no effusion.  Both knees are ligamentously stable with good range of motion of the right knee is still painful to her.  At this point she will continue to work on quad strengthening exercises.  She is a good candidate for hyaluronic acid for the right knee given the mild arthritic findings combined with the failure of conservative treatment including activity modification, quad strengthening exercises and steroid injections as well as anti-inflammatories.  We will see if we can get hyaluronic acid approved to treat the arthritis pain of her right knee.  She agrees with this treatment plan.

## 2022-03-11 ENCOUNTER — Telehealth: Payer: Self-pay

## 2022-03-11 NOTE — Telephone Encounter (Signed)
Right knee gel injection  

## 2022-03-11 NOTE — Telephone Encounter (Signed)
Noted  

## 2022-04-19 NOTE — Telephone Encounter (Signed)
Submited for VOB on [email or web address] 

## 2022-04-27 ENCOUNTER — Telehealth: Payer: Self-pay

## 2022-04-27 NOTE — Telephone Encounter (Signed)
Submitted for Durolane due to Monovisc not being a preferred product.

## 2022-06-12 ENCOUNTER — Other Ambulatory Visit: Payer: Self-pay

## 2022-06-12 ENCOUNTER — Encounter (HOSPITAL_COMMUNITY): Payer: Self-pay | Admitting: Emergency Medicine

## 2022-06-12 ENCOUNTER — Emergency Department (HOSPITAL_COMMUNITY): Payer: Managed Care, Other (non HMO)

## 2022-06-12 ENCOUNTER — Emergency Department (HOSPITAL_COMMUNITY)
Admission: EM | Admit: 2022-06-12 | Discharge: 2022-06-12 | Disposition: A | Discharge status: Home / Self Care | Payer: Managed Care, Other (non HMO) | Attending: Emergency Medicine | Admitting: Emergency Medicine

## 2022-06-12 DIAGNOSIS — R079 Chest pain, unspecified: Secondary | ICD-10-CM | POA: Insufficient documentation

## 2022-06-12 DIAGNOSIS — R110 Nausea: Secondary | ICD-10-CM | POA: Insufficient documentation

## 2022-06-12 DIAGNOSIS — R911 Solitary pulmonary nodule: Secondary | ICD-10-CM | POA: Diagnosis not present

## 2022-06-12 DIAGNOSIS — R1012 Left upper quadrant pain: Secondary | ICD-10-CM | POA: Diagnosis present

## 2022-06-12 DIAGNOSIS — R1013 Epigastric pain: Secondary | ICD-10-CM

## 2022-06-12 LAB — URINALYSIS, ROUTINE W REFLEX MICROSCOPIC
Bilirubin Urine: NEGATIVE
Glucose, UA: NEGATIVE mg/dL
Hgb urine dipstick: NEGATIVE
Ketones, ur: NEGATIVE mg/dL
Leukocytes,Ua: NEGATIVE
Nitrite: NEGATIVE
Protein, ur: 30 mg/dL — AB
Specific Gravity, Urine: 1.028 (ref 1.005–1.030)
pH: 5 (ref 5.0–8.0)

## 2022-06-12 LAB — TROPONIN I (HIGH SENSITIVITY)
Troponin I (High Sensitivity): 8 ng/L (ref <18)
Troponin I (High Sensitivity): 7 ng/L (ref <18)

## 2022-06-12 LAB — COMPREHENSIVE METABOLIC PANEL
ALT: 17 U/L (ref 0–44)
AST: 35 U/L (ref 15–41)
Albumin: 4.2 g/dL (ref 3.5–5.0)
Alkaline Phosphatase: 85 U/L (ref 38–126)
Anion gap: 10 (ref 5–15)
BUN: 10 mg/dL (ref 6–20)
CO2: 24 mmol/L (ref 22–32)
Calcium: 9.7 mg/dL (ref 8.9–10.3)
Chloride: 104 mmol/L (ref 98–111)
Creatinine, Ser: 0.9 mg/dL (ref 0.44–1.00)
GFR, Estimated: >60 mL/min (ref >60)
Glucose, Bld: 103 mg/dL — ABNORMAL HIGH (ref 70–99)
Potassium: 3.7 mmol/L (ref 3.5–5.1)
Sodium: 138 mmol/L (ref 135–145)
Total Bilirubin: 0.2 mg/dL — ABNORMAL LOW (ref 0.3–1.2)
Total Protein: 7.5 g/dL (ref 6.5–8.1)

## 2022-06-12 LAB — CBC
HCT: 42.5 % (ref 36.0–46.0)
Hemoglobin: 14.2 g/dL (ref 12.0–15.0)
MCH: 31.7 pg (ref 26.0–34.0)
MCHC: 33.4 g/dL (ref 30.0–36.0)
MCV: 94.9 fL (ref 80.0–100.0)
Platelets: 338 10*3/uL (ref 150–400)
RBC: 4.48 MIL/uL (ref 3.87–5.11)
RDW: 12.3 % (ref 11.5–15.5)
WBC: 11 10*3/uL — ABNORMAL HIGH (ref 4.0–10.5)
nRBC: 0 % (ref 0.0–0.2)

## 2022-06-12 LAB — LIPASE, BLOOD: Lipase: 29 U/L (ref 11–51)

## 2022-06-12 MED ORDER — IOHEXOL 350 MG/ML SOLN
80.0000 mL | Freq: Once | INTRAVENOUS | Status: AC | PRN
  Administered 2022-06-12: 80 mL via INTRAVENOUS

## 2022-06-12 MED ORDER — SODIUM CHLORIDE 0.9 % IV BOLUS
500.0000 mL | Freq: Once | INTRAVENOUS | Status: AC
  Administered 2022-06-12: 500 mL via INTRAVENOUS

## 2022-06-12 MED ORDER — FENTANYL CITRATE PF 50 MCG/ML IJ SOSY
50.0000 ug | PREFILLED_SYRINGE | Freq: Once | INTRAMUSCULAR | Status: AC
  Administered 2022-06-12: 50 ug via INTRAVENOUS
  Filled 2022-06-12: qty 1

## 2022-06-12 MED ORDER — ONDANSETRON HCL 4 MG/2ML IJ SOLN
4.0000 mg | Freq: Once | INTRAMUSCULAR | Status: AC
  Administered 2022-06-12: 4 mg via INTRAVENOUS
  Filled 2022-06-12: qty 2

## 2022-06-12 NOTE — ED Triage Notes (Signed)
Patient complains of poor appetite and abdominal pain starting a few days ago. Patient also complains of muscle cramping in her left arm and chest yesterday. Patient is alert, oriented, speaking in complete sentences, and is in no apparent distress at this time.

## 2022-06-12 NOTE — ED Notes (Signed)
The pt is not talking much she answers yes and no questions otherwise no conversation

## 2022-06-12 NOTE — Discharge Instructions (Signed)
Follow-up for repeat CT scan by her primary doctor likely in 6 months to make sure your pulmonary nodule is not growing. Follow-up with cardiology to arrange stress test.

## 2022-06-12 NOTE — ED Provider Notes (Signed)
[facility] EMERGENCY DEPARTMENT Provider Note   CSN: [id number] Arrival date & time: 06/12/22  1250     History  Chief Complaint  Patient presents with   Abdominal Pain    Tasha Fox is a 61 y.o. female.  Patient presents with decreased appetite, left upper abdominal pain rating to the back and muscle cramping for the past 2 days.  Patient had muscle cramping left arm and also in her legs.  No history of similar.  No sick contacts no diarrhea.  Decreased bowel movements.  Patient has no known heart problems or blood clot history.  No gallbladder problems.  No fevers or chills.  No shortness of breath.  Pain starts under left breast area and goes to the back.  No recent stress test.  No active medical problems.        Home Medications Prior to Admission medications   Medication Sig Start Date End Date Taking? Authorizing Provider  clindamycin (CLEOCIN T) 1 % external solution clindamycin phosphate 1 % topical solution  APPLY ONE APPLICATION TOPICALLY TWICE DAILY. 07/09/18   [provider]  diazepam (VALIUM) 5 MG tablet diazepam 5 mg tablet  TAKE 1 TO 2 TABLETS BY MOUTH 90 MINUTES BEFORE PROCEDURE MAY REPEAT 2 DOSES 30 MINUTES LATER BUT BEFORE PROCEDURE    [provider]  doxepin (SINEQUAN) 10 MG capsule doxepin 10 mg capsule  TAKE 1 TO 2 CAPSULES BY MOUTH AT BEDTIME    [provider]  doxycycline (VIBRAMYCIN) 100 MG capsule doxycycline hyclate 100 mg capsule  TAKE 1 CAPSULE BY MOUTH TWICE DAILY TAKE WITH WATER 1 HOUR AFTER A MEAL 06/19/18   [provider]  fluconazole (DIFLUCAN) 150 MG tablet fluconazole 150 mg tablet  TAKE 1 TABLET BY MOUTH ONCE A WEEK AS NEEDED FOR YEAST INFECTION    [provider]  gabapentin (NEURONTIN) 300 MG capsule gabapentin 300 mg capsule  TAKE 1 CAPSULE BY MOUTH THREE TIMES DAILY AS DIRECTED FOR 30 DAYS 07/23/18   [provider]  HYDROcodone-acetaminophen (NORCO/VICODIN)  5-325 MG tablet Take 1-2 tablets by mouth every 6 (six) hours as needed for moderate pain. 04/30/20   Kathryne Hitch, MD  ibuprofen (ADVIL) 800 MG tablet ibuprofen 800 mg tablet  TAKE 1 TABLET BY MOUTH EVERY 8 HOURS AS NEEDED FOR PAIN    [provider]  ketoconazole (NIZORAL) 2 % shampoo ketoconazole 2 % shampoo  APPLY 1 APPLICATION TOPICAL 3 TIMES A WEEK 05/18/18   [provider]  ketorolac (TORADOL) 10 MG tablet ketorolac 10 mg tablet  TAKE 1 TABLET BY MOUTH 4 TIMES DAILY    [provider]  meloxicam (MOBIC) 15 MG tablet meloxicam 15 mg tablet  TAKE 1 TABLET BY MOUTH ONCE DAILY AFTER A MEAL FOR 30 DAYS    [provider]  mirtazapine (REMERON) 7.5 MG tablet mirtazapine 7.5 mg tablet  TAKE 1 TABLET BY MOUTH AT BEDTIME TO HELP WITH APPETITE. 07/07/18   [provider]  nabumetone (RELAFEN) 500 MG tablet Take 1 tablet (500 mg total) by mouth 2 (two) times daily as needed. 02/09/22   Kathryne Hitch, MD  Omega-3 Fatty Acids (FISH OIL) 1000 MG CAPS Take by mouth.    [provider]  ondansetron (ZOFRAN-ODT) 4 MG disintegrating tablet Take 1 tablet (4 mg total) by mouth every 8 (eight) hours as needed for nausea or vomiting. 01/29/21   Terrilee Files, MD  oxyCODONE (ROXICODONE) 5 MG immediate release tablet Take  1 tablet (5 mg total) by mouth every 6 (six) hours as needed for severe pain. 05/07/20   Kathryne HitchBlackman, Christopher Y, MD  pantoprazole (PROTONIX) 20 MG tablet Take 1 tablet (20 mg total) by mouth daily. 01/29/21   Terrilee FilesButler, Michael C, MD      Allergies    Sulfa antibiotics and Sulfamethoxazole-trimethoprim    Review of Systems   Review of Systems  Constitutional:  Positive for fatigue. Negative for chills and fever.  HENT:  Negative for congestion.   Eyes:  Negative for visual disturbance.  Respiratory:  Negative for shortness of breath.   Cardiovascular:  Positive for chest pain.  Gastrointestinal:  Positive for abdominal  pain and nausea. Negative for vomiting.  Genitourinary:  Positive for flank pain. Negative for dysuria.  Musculoskeletal:  Negative for back pain, neck pain and neck stiffness.  Skin:  Negative for rash.  Neurological:  Negative for light-headedness and headaches.    Physical Exam Updated Vital Signs BP (!) 166/82   Pulse 82   Temp 98.8 F (37.1 C)   Resp 17   LMP  (LMP Unknown)   SpO2 100%  Physical Exam Vitals and nursing note reviewed.  Constitutional:      General: She is not in acute distress.    Appearance: She is well-developed.  HENT:     Head: Normocephalic and atraumatic.     Mouth/Throat:     Mouth: Mucous membranes are moist.  Eyes:     General:        Right eye: No discharge.        Left eye: No discharge.     Conjunctiva/sclera: Conjunctivae normal.  Neck:     Trachea: No tracheal deviation.  Cardiovascular:     Rate and Rhythm: Normal rate and regular rhythm.     Comments: 2+ pulses distal extremities bilateral Pulmonary:     Effort: Pulmonary effort is normal.     Breath sounds: Normal breath sounds.  Abdominal:     General: There is no distension.     Palpations: Abdomen is soft.     Tenderness: There is abdominal tenderness in the epigastric area and left upper quadrant. There is no guarding.  Musculoskeletal:     Cervical back: Normal range of motion and neck supple. No rigidity.  Skin:    General: Skin is warm.     Capillary Refill: Capillary refill takes 2 to 3 seconds.     Findings: No rash.  Neurological:     General: No focal deficit present.     Mental Status: She is alert.     Cranial Nerves: No cranial nerve deficit.  Psychiatric:        Mood and Affect: Mood normal.     ED Results / Procedures / Treatments   Labs (all labs ordered are listed, but only abnormal results are displayed) Labs Reviewed  COMPREHENSIVE METABOLIC PANEL - Abnormal; Notable for the following components:      Result Value   Glucose, Bld 103 (*)    Total  Bilirubin 0.2 (*)    All other components within normal limits  CBC - Abnormal; Notable for the following components:   WBC 11.0 (*)    All other components within normal limits  URINALYSIS, ROUTINE W REFLEX MICROSCOPIC - Abnormal; Notable for the following components:   Color, Urine AMBER (*)    APPearance HAZY (*)    Protein, ur 30 (*)    Bacteria, UA RARE (*)    All  other components within normal limits  LIPASE, BLOOD  TROPONIN I (HIGH SENSITIVITY)  TROPONIN I (HIGH SENSITIVITY)    EKG EKG Interpretation  Date/Time:  Sunday June 12 2022 15:29:29 EDT Ventricular Rate:  81 PR Interval:  163 QRS Duration: 80 QT Interval:  365 QTC Calculation: 424 R Axis:   39 Text Interpretation: Sinus rhythm RSR' in V1 or V2, right VCD or RVH Confirmed by Blane Ohara [phone]) on 06/12/2022 3:40:34 PM  Radiology CT Angio Chest/Abd/Pel for Dissection W and/or Wo Contrast  Result Date: 06/12/2022 CLINICAL DATA:  left chest pain to the back EXAM: CT ANGIOGRAPHY CHEST, ABDOMEN AND PELVIS TECHNIQUE: Non-contrast CT of the chest was initially obtained. Multidetector CT imaging through the chest, abdomen and pelvis was performed using the standard protocol during bolus administration of intravenous contrast. Multiplanar reconstructed images and MIPs were obtained and reviewed to evaluate the vascular anatomy. RADIATION DOSE REDUCTION: This exam was performed according to the departmental dose-optimization program which includes automated exposure control, adjustment of the mA and/or kV according to patient size and/or use of iterative reconstruction technique. CONTRAST:  69mL OMNIPAQUE IOHEXOL 350 MG/ML SOLN COMPARISON:  None Available. FINDINGS: CTA CHEST FINDINGS Cardiovascular: Preferential opacification of the thoracic aorta. No evidence of thoracic aortic aneurysm or dissection. Normal heart size. No significant pericardial effusion. At least mild calcified and noncalcified atherosclerotic plaque  of the thoracic aorta. No coronary artery calcifications. The main pulmonary artery is normal in caliber. No central or proximal segmental pulmonary embolus. Unable to evaluate more distally due to timing of contrast. Mediastinum/Nodes: No enlarged mediastinal, hilar, or axillary lymph nodes. Trachea esophagus demonstrate no significant findings. Subcentimeter right thyroid nodule. Not clinically significant; no follow-up imaging recommended (ref: J Am Coll Radiol. 2015 Feb;12(2): 143-50). Lungs/Pleura: Mild centrilobular emphysematous changes. No focal consolidation. Punctate pulmonary micronodule within the left upper lobe (8:36). No pulmonary mass. No pleural effusion. No pneumothorax. Musculoskeletal: No chest wall abnormality. No suspicious lytic or blastic osseous lesions. No acute displaced fracture. Review of the MIP images confirms the above findings. CTA ABDOMEN AND PELVIS FINDINGS VASCULAR Aorta: At least moderate calcified and noncalcified atherosclerotic plaque. Normal caliber aorta without aneurysm, dissection, vasculitis or significant stenosis. Celiac: Patent without evidence of aneurysm, dissection, vasculitis or significant stenosis. SMA: Patent without evidence of aneurysm, dissection, vasculitis or significant stenosis. Renals: Both renal arteries are patent without evidence of aneurysm, dissection, vasculitis, fibromuscular dysplasia or significant stenosis. IMA: At least mild atherosclerotic plaque. Patent without evidence of aneurysm, dissection, vasculitis or significant stenosis. Inflow: Patent without evidence of aneurysm, dissection, vasculitis or significant stenosis. Veins: No obvious venous abnormality within the limitations of this arterial phase study. Review of the MIP images confirms the above findings. NON-VASCULAR Hepatobiliary: No focal liver abnormality. No gallstones, gallbladder wall thickening, or pericholecystic fluid. No biliary dilatation. Pancreas: No focal lesion. Normal  pancreatic contour. No surrounding inflammatory changes. No main pancreatic ductal dilatation. Spleen: Normal in size without focal abnormality. Adrenals/Urinary Tract: No adrenal nodule bilaterally. Bilateral kidneys enhance symmetrically. No hydronephrosis. No hydroureter. The urinary bladder is unremarkable. Stomach/Bowel: Stomach is within normal limits. No evidence of bowel wall thickening or dilatation. Appendix appears normal. Lymphatic: No lymphadenopathy Reproductive: Uterus and bilateral adnexa are unremarkable. Other: No intraperitoneal free fluid. No intraperitoneal free gas. No organized fluid collection. Musculoskeletal: No abdominal wall hernia or abnormality. No suspicious lytic or blastic osseous lesions. No acute displaced fracture. L5-S1 intervertebral disc space vacuum phenomenon and facet arthropathy. Review of the MIP images confirms the above findings.  IMPRESSION: 1. No acute aortic abnormality. Aortic Atherosclerosis (ICD10-I70.0). 2. No central or proximal segmental pulmonary embolus. Unable to evaluate more distally due to timing of contrast. 3. Emphysema (ICD10-J43.9). 4. Punctate left upper lobe pulmonary nodule. No follow-up needed if patient is low-risk.This recommendation follows the consensus statement: Guidelines for Management of Incidental Pulmonary Nodules Detected on CT Images: From the Fleischner Society 2017; Radiology 2017; 284:228-243. 5. No acute intra-abdominal or intrapelvic abnormality. Electronically Signed   By: Iven Finn M.D.   On: 06/12/2022 17:23   DG Chest 2 View  Result Date: 06/12/2022 CLINICAL DATA:  Chest pain and shortness of breath. EXAM: CHEST - 2 VIEW COMPARISON:  None Available. FINDINGS: Normal heart, mediastinum and hila. Lungs are clear.  No pleural effusion or pneumothorax. Skeletal structures are intact. IMPRESSION: No active cardiopulmonary disease. Electronically Signed   By: Lajean Manes M.D.   On: 06/12/2022 14:11     Procedures Procedures    Medications Ordered in ED Medications  fentaNYL (SUBLIMAZE) injection 50 mcg (50 mcg Intravenous Given 06/12/22 1633)  ondansetron (ZOFRAN) injection 4 mg (4 mg Intravenous Given 06/12/22 1633)  sodium chloride 0.9 % bolus 500 mL (0 mLs Intravenous Stopped 06/12/22 1703)  iohexol (OMNIPAQUE) 350 MG/ML injection 80 mL (80 mLs Intravenous Contrast Given 06/12/22 1711)    ED Course/ Medical Decision Making/ A&P                           Medical Decision Making Amount and/or Complexity of Data Reviewed Labs: ordered. Radiology: ordered.  Risk Prescription drug management.   Patient presents with recurrent left lower chest and upper abdominal pain for 2 days and nausea with cramping.  Differential includes cardiac related, reflux, ulcer, referred pain from stomach/gallbladder, kidney stone, urine infection.  Patient denies infectious symptoms at this time or cough to suggest lower lobe pneumonia.  Screening blood work showed normal white count, normal hemoglobin, electrolytes unremarkable, kidney function normal.  Troponin initially negative.  EKG no signs of acute ischemia.  Patient is lower to moderate risk based on age and description of chest pain, hear score.  Plan for second troponin, CT dissection protocol given chest pain radiating through to the back and no other definitive cause.  Pain meds and nausea meds ordered IV fluid bolus given.  On reassessment patient's symptoms resolved and well-appearing.  She has an appetite and wants to go home and eat.  2 troponins negative no signs of acute ischemia.  CT scan ordered and independently reviewed results showing no acute findings, small pulmonary nodule noted and discussed this in detail follow-up with patient and family member.  Patient comfortable follow-up with cardiology and primary doctor later this week.         Final Clinical Impression(s) / ED Diagnoses Final diagnoses:  Acute chest pain   Abdominal pain, epigastric  Nausea in adult  Left upper lobe pulmonary nodule    Rx / DC Orders ED Discharge Orders          Ordered    Ambulatory referral to Cardiology       Comments: If you have not heard from the Cardiology office within the next 72 hours please call [phone].   06/12/22 [id number]              Elnora Morrison, MD 06/12/22 1941

## 2022-06-14 ENCOUNTER — Encounter (HOSPITAL_COMMUNITY): Payer: Self-pay

## 2022-06-14 ENCOUNTER — Emergency Department (HOSPITAL_COMMUNITY)
Admission: EM | Admit: 2022-06-14 | Discharge: 2022-06-14 | Disposition: A | Discharge status: Home / Self Care | Payer: Managed Care, Other (non HMO) | Attending: Emergency Medicine | Admitting: Emergency Medicine

## 2022-06-14 ENCOUNTER — Other Ambulatory Visit: Payer: Self-pay

## 2022-06-14 DIAGNOSIS — R112 Nausea with vomiting, unspecified: Secondary | ICD-10-CM | POA: Diagnosis not present

## 2022-06-14 DIAGNOSIS — K5900 Constipation, unspecified: Secondary | ICD-10-CM | POA: Diagnosis not present

## 2022-06-14 DIAGNOSIS — R1012 Left upper quadrant pain: Secondary | ICD-10-CM | POA: Diagnosis present

## 2022-06-14 LAB — COMPREHENSIVE METABOLIC PANEL
ALT: 18 U/L (ref 0–44)
AST: 29 U/L (ref 15–41)
Albumin: 4.3 g/dL (ref 3.5–5.0)
Alkaline Phosphatase: 80 U/L (ref 38–126)
Anion gap: 11 (ref 5–15)
BUN: 10 mg/dL (ref 6–20)
CO2: 25 mmol/L (ref 22–32)
Calcium: 9.5 mg/dL (ref 8.9–10.3)
Chloride: 101 mmol/L (ref 98–111)
Creatinine, Ser: 0.67 mg/dL (ref 0.44–1.00)
GFR, Estimated: >60 mL/min (ref >60)
Glucose, Bld: 105 mg/dL — ABNORMAL HIGH (ref 70–99)
Potassium: 3.5 mmol/L (ref 3.5–5.1)
Sodium: 137 mmol/L (ref 135–145)
Total Bilirubin: 0.7 mg/dL (ref 0.3–1.2)
Total Protein: 7.5 g/dL (ref 6.5–8.1)

## 2022-06-14 LAB — LIPASE, BLOOD: Lipase: 27 U/L (ref 11–51)

## 2022-06-14 LAB — CBC
HCT: 42.8 % (ref 36.0–46.0)
Hemoglobin: 14.2 g/dL (ref 12.0–15.0)
MCH: 31.5 pg (ref 26.0–34.0)
MCHC: 33.2 g/dL (ref 30.0–36.0)
MCV: 94.9 fL (ref 80.0–100.0)
Platelets: 344 10*3/uL (ref 150–400)
RBC: 4.51 MIL/uL (ref 3.87–5.11)
RDW: 12.1 % (ref 11.5–15.5)
WBC: 11.9 10*3/uL — ABNORMAL HIGH (ref 4.0–10.5)
nRBC: 0 % (ref 0.0–0.2)

## 2022-06-14 MED ORDER — ALUM & MAG HYDROXIDE-SIMETH 200-200-20 MG/5ML PO SUSP
30.0000 mL | Freq: Once | ORAL | Status: AC
  Administered 2022-06-14: 30 mL via ORAL
  Filled 2022-06-14: qty 30

## 2022-06-14 MED ORDER — PANTOPRAZOLE SODIUM 20 MG PO TBEC: 20.0000 mg | DELAYED_RELEASE_TABLET | Freq: Every day | ORAL | 1 refills | Status: DC

## 2022-06-14 NOTE — Discharge Instructions (Signed)
Return for any problem.  ?

## 2022-06-14 NOTE — ED Triage Notes (Signed)
Patient c/o mid abdominal pain, N/V and constipation and states she can not remember when she had a normal BM. Patient states she had a suppository last night and had a small amount of stool with that.  Patient also c/o body cramping x 4 days

## 2022-06-14 NOTE — ED Provider Notes (Signed)
[facility]-EMERGENCY DEPT Provider Note   CSN: [id number] Arrival date & time: 06/14/22  1820     History  Chief Complaint  Patient presents with   Abdominal Pain   Constipation   Emesis   body cramping    Tasha Fox is a 61 y.o. female.  61 year old female with prior medical history as detailed below presents for evaluation.  Patient is complaining of left upper quadrant abdominal burning discomfort.  She requests " that drink for heartburn" that she apparently received at prior ED evaluation.  She feels that a GI cocktail would make her symptoms improved significantly.  She reports probable history of GERD.  She reports that she is not currently taking an antacid.  She does not have established GI care.  She denies other complaint to this provider including diffuse abdominal pain, nausea, vomiting, constipation, or other complaint.  The history is provided by the patient and medical records.  Abdominal Pain Pain location:  LUQ Pain quality: aching and burning   Associated symptoms: constipation and vomiting   Constipation Associated symptoms: abdominal pain and vomiting   Emesis Associated symptoms: abdominal pain        Home Medications Prior to Admission medications   Medication Sig Start Date End Date Taking? Authorizing Provider  pantoprazole (PROTONIX) 20 MG tablet Take 1 tablet (20 mg total) by mouth daily. 06/14/22  Yes Babatunde Seago, Noralyn Pick, MD  clindamycin (CLEOCIN T) 1 % external solution clindamycin phosphate 1 % topical solution  APPLY ONE APPLICATION TOPICALLY TWICE DAILY. 07/09/18   [provider]  diazepam (VALIUM) 5 MG tablet diazepam 5 mg tablet  TAKE 1 TO 2 TABLETS BY MOUTH 90 MINUTES BEFORE PROCEDURE MAY REPEAT 2 DOSES 30 MINUTES LATER BUT BEFORE PROCEDURE    [provider]  doxepin (SINEQUAN) 10 MG capsule doxepin 10 mg capsule  TAKE 1 TO 2 CAPSULES BY MOUTH AT BEDTIME    [provider]   doxycycline (VIBRAMYCIN) 100 MG capsule doxycycline hyclate 100 mg capsule  TAKE 1 CAPSULE BY MOUTH TWICE DAILY TAKE WITH WATER 1 HOUR AFTER A MEAL 06/19/18   [provider]  fluconazole (DIFLUCAN) 150 MG tablet fluconazole 150 mg tablet  TAKE 1 TABLET BY MOUTH ONCE A WEEK AS NEEDED FOR YEAST INFECTION    [provider]  gabapentin (NEURONTIN) 300 MG capsule gabapentin 300 mg capsule  TAKE 1 CAPSULE BY MOUTH THREE TIMES DAILY AS DIRECTED FOR 30 DAYS 07/23/18   [provider]  HYDROcodone-acetaminophen (NORCO/VICODIN) 5-325 MG tablet Take 1-2 tablets by mouth every 6 (six) hours as needed for moderate pain. 04/30/20   Kathryne Hitch, MD  ibuprofen (ADVIL) 800 MG tablet ibuprofen 800 mg tablet  TAKE 1 TABLET BY MOUTH EVERY 8 HOURS AS NEEDED FOR PAIN    [provider]  ketoconazole (NIZORAL) 2 % shampoo ketoconazole 2 % shampoo  APPLY 1 APPLICATION TOPICAL 3 TIMES A WEEK 05/18/18   [provider]  ketorolac (TORADOL) 10 MG tablet ketorolac 10 mg tablet  TAKE 1 TABLET BY MOUTH 4 TIMES DAILY    [provider]  meloxicam (MOBIC) 15 MG tablet meloxicam 15 mg tablet  TAKE 1 TABLET BY MOUTH ONCE DAILY AFTER A MEAL FOR 30 DAYS    [provider]  mirtazapine (REMERON) 7.5 MG tablet mirtazapine 7.5 mg tablet  TAKE 1 TABLET BY MOUTH AT BEDTIME TO HELP WITH APPETITE. 07/07/18   [provider]  nabumetone (RELAFEN) 500 MG tablet Take 1  tablet (500 mg total) by mouth 2 (two) times daily as needed. 02/09/22   Kathryne Hitch, MD  Omega-3 Fatty Acids (FISH OIL) 1000 MG CAPS Take by mouth.    [provider]  ondansetron (ZOFRAN-ODT) 4 MG disintegrating tablet Take 1 tablet (4 mg total) by mouth every 8 (eight) hours as needed for nausea or vomiting. 01/29/21   Terrilee Files, MD  oxyCODONE (ROXICODONE) 5 MG immediate release tablet Take 1 tablet (5 mg total) by mouth every 6 (six) hours as needed for severe  pain. 05/07/20   Kathryne Hitch, MD  pantoprazole (PROTONIX) 20 MG tablet Take 1 tablet (20 mg total) by mouth daily. 01/29/21   Terrilee Files, MD      Allergies    Sulfa antibiotics and Sulfamethoxazole-trimethoprim    Review of Systems   Review of Systems  Gastrointestinal:  Positive for abdominal pain, constipation and vomiting.  All other systems reviewed and are negative.   Physical Exam Updated Vital Signs BP (!) 153/78   Pulse 88   Temp 99.5 F (37.5 C) (Oral)   Resp 15   Ht 5\' 4"  (1.626 m)   Wt 59 kg   LMP  (LMP Unknown)   SpO2 96%   BMI 22.31 kg/m  Physical Exam Vitals and nursing note reviewed.  Constitutional:      General: She is not in acute distress.    Appearance: Normal appearance. She is well-developed.  HENT:     Head: Normocephalic and atraumatic.  Eyes:     Conjunctiva/sclera: Conjunctivae normal.     Pupils: Pupils are equal, round, and reactive to light.  Cardiovascular:     Rate and Rhythm: Normal rate and regular rhythm.     Heart sounds: Normal heart sounds.  Pulmonary:     Effort: Pulmonary effort is normal. No respiratory distress.     Breath sounds: Normal breath sounds.  Abdominal:     General: There is no distension.     Palpations: Abdomen is soft.     Tenderness: There is no abdominal tenderness.  Musculoskeletal:        General: No deformity. Normal range of motion.     Cervical back: Normal range of motion and neck supple.  Skin:    General: Skin is warm and dry.  Neurological:     General: No focal deficit present.     Mental Status: She is alert and oriented to person, place, and time.     ED Results / Procedures / Treatments   Labs (all labs ordered are listed, but only abnormal results are displayed) Labs Reviewed  COMPREHENSIVE METABOLIC PANEL - Abnormal; Notable for the following components:      Result Value   Glucose, Bld 105 (*)    All other components within normal limits  CBC - Abnormal; Notable  for the following components:   WBC 11.9 (*)    All other components within normal limits  LIPASE, BLOOD    EKG EKG Interpretation  Date/Time:  Tuesday June 14 2022 19:25:35 EDT Ventricular Rate:  85 PR Interval:  153 QRS Duration: 86 QT Interval:  371 QTC Calculation: 442 R Axis:   8 Text Interpretation: Sinus rhythm RSR' in V1 or V2, probably normal variant Confirmed by 04-18-1998 [phone]) on 06/14/2022 7:38:26 PM  Radiology No results found.  Procedures Procedures    Medications Ordered in ED Medications  alum & mag hydroxide-simeth (MAALOX/MYLANTA) 200-200-20 MG/5ML suspension 30 mL (30 mLs Oral Given  06/14/22 1921)    ED Course/ Medical Decision Making/ A&P                           Medical Decision Making Amount and/or Complexity of Data Reviewed Labs: ordered.  Risk OTC drugs. Prescription drug management.    Medical Screen Complete  This patient presented to the ED with complaint of left upper quadrant discomfort.  This complaint involves an extensive number of treatment options. The initial differential diagnosis includes, but is not limited to, GERD, metabolic abnormality, etc.  This presentation is: Acute, Chronic, Self-Limited, Previously Undiagnosed, Uncertain Prognosis, Complicated, Systemic Symptoms, and Threat to Life/Bodily Function  Patient  with probable history of GERD presents with complaint of burning discomfort in the left upper quadrant.  Exam is without evidence of significant abnormality.  Patient did request GI cocktail.  With administration of Maalox/Mylanta the patient feels significantly improved.  Reevaluation reveals a comfortable patient.  She is taking good p.o.  She now desires discharge home.  Screening labs obtained are without significant abnormality.    Patient declines additional work-up here in the ED.  She specifically declines to CT imaging and/or additional labs.  Patient and patient's daughter understand  need for close outpatient follow-up -they especially understand need to establish care with GI.  Strict return precautions given and understood.  Importance of close follow-up is repeatedly stressed.  Additional history obtained:  Additional history obtained from [facility] External records from outside sources obtained and reviewed including prior ED visits and prior Inpatient records.    Lab Tests:  I ordered and personally interpreted labs.  The pertinent results include: BC, CMP, lipase Cardiac Monitoring:  The patient was maintained on a cardiac monitor.  I personally viewed and interpreted the cardiac monitor which showed an underlying rhythm of: NSR   Medicines ordered:  I ordered medication including Maalox for GERD Reevaluation of the patient after these medicines showed that the patient: improved    Problem List / ED Course:  GERD   Reevaluation:  After the interventions noted above, I reevaluated the patient and found that they have: improved   Disposition:  After consideration of the diagnostic results and the patients response to treatment, I feel that the patent would benefit from close outpatient followup.          Final Clinical Impression(s) / ED Diagnoses Final diagnoses:  Left upper quadrant abdominal pain    Rx / DC Orders ED Discharge Orders          Ordered    pantoprazole (PROTONIX) 20 MG tablet  Daily        06/14/22 2036              Valarie Merino, MD 06/14/22 2041

## 2022-06-15 ENCOUNTER — Encounter: Payer: Self-pay | Admitting: Internal Medicine

## 2022-06-15 ENCOUNTER — Ambulatory Visit (HOSPITAL_COMMUNITY)
Admission: RE | Admit: 2022-06-15 | Discharge: 2022-06-15 | Disposition: A | Discharge status: Home / Self Care | Payer: Managed Care, Other (non HMO) | Source: Ambulatory Visit | Attending: Internal Medicine | Admitting: Internal Medicine

## 2022-06-15 ENCOUNTER — Ambulatory Visit: Payer: Commercial Managed Care - HMO | Admitting: Internal Medicine

## 2022-06-15 VITALS — BP 153/85 | HR 101 | Temp 98.1°F | Resp 16 | Ht 64.0 in | Wt 128.6 lb

## 2022-06-15 DIAGNOSIS — I10 Essential (primary) hypertension: Secondary | ICD-10-CM | POA: Insufficient documentation

## 2022-06-15 DIAGNOSIS — R1011 Right upper quadrant pain: Secondary | ICD-10-CM | POA: Insufficient documentation

## 2022-06-15 DIAGNOSIS — R079 Chest pain, unspecified: Secondary | ICD-10-CM | POA: Insufficient documentation

## 2022-06-15 MED ORDER — METOPROLOL SUCCINATE ER 25 MG PO TB24: 25.0000 mg | ORAL_TABLET | Freq: Every day | ORAL | 3 refills | Status: DC

## 2022-06-15 NOTE — Progress Notes (Signed)
Primary Physician/Referring:  Patient, No Pcp Per  Patient ID: Tasha Fox, female    DOB: May 16, 1961, 60 y.o.   MRN: [id number]  Chief Complaint  Patient presents with   Acute chest pain   New Patient (Initial Visit)   HPI:    Tasha Fox  is a 61 y.o. female with hypertension, chest pain, and refractory abdominal pain who is here to establish care with cardiology. She has been to the ED twice in the last week with nausea, vomiting, and severe abdominal pain. Her pain is mainly in the RUQ, however, it does radiate to her shoulders. She feels like there is a band going around to her back so cardiac etiology may be contributing to this as well. She tested herself for covid twice at home and the test was negative on Monday. Patient had EKGs and troponins ordered in the ED and both came back normal. She has never had a stress test or echo in the past. She is agreeable to this. She is not on anti-hypertensive medications and believes her heart rate is elevated due to pain.  At rest, she denies chest pain, shortness of breath, palpitations, diaphoresis, syncope, edema, PND, orthopnea.   Past Medical History:  Diagnosis Date   History of stomach ulcers    Past Surgical History:  Procedure Laterality Date   KNEE ARTHROSCOPY Right    Family History  Problem Relation Age of Onset   Diabetes Mother     Social History   Tobacco Use   Smoking status: Former    Packs/day: 2.00    Years: 35.00    Total pack years: 70.00    Types: Cigarettes   Smokeless tobacco: Never  Substance Use Topics   Alcohol use: Not Currently   Marital Status: Single  ROS  Review of Systems  Cardiovascular:  Positive for chest pain.  Gastrointestinal:  Positive for abdominal pain, heartburn, nausea and vomiting.  Objective  Blood pressure (!) 153/85, pulse (!) 101, temperature 98.1 F (36.7 C), temperature source Temporal, resp. rate 16, height 5\' 4"  (1.626 m), weight 128 lb 9.6 oz (58.3 kg), SpO2 100  %. Body mass index is 22.07 kg/m.     06/15/2022    1:59 PM 06/15/2022    1:46 PM 06/14/2022    8:45 PM  Vitals with BMI  Height  5\' 4"    Weight  128 lbs 10 oz   BMI  82.99   Systolic 371 696 789  Diastolic 85 97 80  Pulse 381 102 92     Physical Exam Vitals and nursing note reviewed.  Constitutional:      Appearance: She is ill-appearing.  Cardiovascular:     Rate and Rhythm: Normal rate and regular rhythm.     Pulses: Normal pulses.     Heart sounds: Normal heart sounds. No murmur heard. Pulmonary:     Effort: Pulmonary effort is normal.     Breath sounds: Normal breath sounds.  Abdominal:     Tenderness: There is abdominal tenderness.  Musculoskeletal:     Right lower leg: No edema.     Left lower leg: No edema.  Skin:    General: Skin is warm and dry.   Medications and allergies   Allergies  Allergen Reactions   Sulfa Antibiotics Swelling   Sulfamethoxazole-Trimethoprim Swelling     Medication list after today's encounter   Current Outpatient Medications:    metoprolol succinate (TOPROL XL) 25 MG 24 hr tablet, Take 1 tablet (25 mg  total) by mouth daily., Disp: 30 tablet, Rfl: 3  Laboratory examination:   Lab Results  Component Value Date   NA 137 06/14/2022   K 3.5 06/14/2022   CO2 25 06/14/2022   GLUCOSE 105 (H) 06/14/2022   BUN 10 06/14/2022   CREATININE 0.67 06/14/2022   CALCIUM 9.5 06/14/2022   GFRNONAA >60 06/14/2022       Latest Ref Rng & Units 06/14/2022    6:47 PM 06/12/2022    1:31 PM 01/29/2021   11:21 AM  CMP  Glucose 70 - 99 mg/dL 016  010  92   BUN 6 - 20 mg/dL 10  10  12    Creatinine 0.44 - 1.00 mg/dL  9.32  3.55   Sodium 135 - 145 mmol/L 137  138  136   Potassium 3.5 - 5.1 mmol/L 3.5  3.7  3.9   Chloride 98 - 111 mmol/L 101  104  100   CO2 22 - 32 mmol/L 25  24  24    Calcium 8.9 - 10.3 mg/dL 9.5  9.7  9.8   Total Protein 6.5 - 8.1 g/dL 7.5  7.5  7.7   Total Bilirubin 0.3 - 1.2 mg/dL 0.7  0.2  0.8   Alkaline Phos 38 - 126  U/L 80  85  86   AST 15 - 41 U/L 29  35  12   ALT 0 - 44 U/L 18  17  7        Latest Ref Rng & Units 06/14/2022    6:47 PM 06/12/2022    1:31 PM 01/29/2021   11:21 AM  CBC  WBC 4.0 - 10.5 K/uL 11.9  11.0  11.5   Hemoglobin 12.0 - 15.0 g/dL 06/16/2022  06/14/2022  03/31/2021   Hematocrit 36.0 - 46.0 % 42.8  42.5  41.4   Platelets 150 - 400 K/uL 344  338  331     Lipid Panel No results for input(s): "CHOL", "TRIG", "LDLCALC", "VLDL", "HDL", "CHOLHDL", "LDLDIRECT" in the last 8760 hours.  HEMOGLOBIN A1C No results found for: "HGBA1C", "MPG" TSH No results for input(s): "TSH" in the last 8760 hours.  External labs:     Radiology:    Cardiac Studies:   No results found for this or any previous visit from the past 1095 days.     No results found for this or any previous visit from the past 1095 days.     EKG:     06/15/22: normal sinus rhythm  Assessment     ICD-10-CM   1. Chest pain of uncertain etiology  R07.9 EKG 12-Lead    PCV ECHOCARDIOGRAM COMPLETE    PCV MYOCARDIAL PERFUSION WO LEXISCAN    54.2 Abdomen Limited RUQ (LIVER/GB)    Ambulatory referral to Gastroenterology    2. Essential hypertension  I10 PCV ECHOCARDIOGRAM COMPLETE    PCV MYOCARDIAL PERFUSION WO LEXISCAN    70.6 Abdomen Limited RUQ (LIVER/GB)    Ambulatory referral to Gastroenterology    3. Right upper quadrant abdominal pain  R10.11 PCV ECHOCARDIOGRAM COMPLETE    PCV MYOCARDIAL PERFUSION WO LEXISCAN    06/17/22 Abdomen Limited RUQ (LIVER/GB)    Ambulatory referral to Gastroenterology       Orders Placed This Encounter  Procedures   US Abdomen Limited RUQ (LIVER/GB)    Order Specific Question:   Reason for Exam (SYMPTOM  OR DIAGNOSIS REQUIRED)    Answer:   cholecystitis    Order Specific Question:   Preferred imaging  location?    Answer:   [facility]    Order Specific Question:   Release to patient    Answer:   Immediate   Ambulatory referral to Gastroenterology    Referral Priority:   Routine     Referral Type:   Consultation    Referral Reason:   Specialty Services Required    Number of Visits Requested:   1   PCV MYOCARDIAL PERFUSION WO LEXISCAN    Standing Status:   Future    Standing Expiration Date:   08/15/2022   EKG 12-Lead   PCV ECHOCARDIOGRAM COMPLETE    Standing Status:   Future    Standing Expiration Date:   06/16/2023    Meds ordered this encounter  Medications   metoprolol succinate (TOPROL XL) 25 MG 24 hr tablet    Sig: Take 1 tablet (25 mg total) by mouth daily.    Dispense:  30 tablet    Refill:  3    Medications Discontinued During This Encounter  Medication Reason   clindamycin (CLEOCIN T) 1 % external solution    diazepam (VALIUM) 5 MG tablet    pantoprazole (PROTONIX) 20 MG tablet    doxycycline (VIBRAMYCIN) 100 MG capsule    doxepin (SINEQUAN) 10 MG capsule    fluconazole (DIFLUCAN) 150 MG tablet    gabapentin (NEURONTIN) 300 MG capsule    HYDROcodone-acetaminophen (NORCO/VICODIN) 5-325 MG tablet    pantoprazole (PROTONIX) 20 MG tablet    oxyCODONE (ROXICODONE) 5 MG immediate release tablet    ondansetron (ZOFRAN-ODT) 4 MG disintegrating tablet    Omega-3 Fatty Acids (FISH OIL) 1000 MG CAPS    nabumetone (RELAFEN) 500 MG tablet    mirtazapine (REMERON) 7.5 MG tablet    meloxicam (MOBIC) 15 MG tablet    ketorolac (TORADOL) 10 MG tablet    ketoconazole (NIZORAL) 2 % shampoo    ibuprofen (ADVIL) 800 MG tablet      Recommendations:   Tasha Fox is a 61 y.o.  F with HTN, chest pain, and acute abdominal pain  Toprol-XL 25mg  sent to pharmacy. Continue current medications. Encourage low-sodium diet, less than 2000 mg daily. Schedule imaging tests in office - echo and stress test. RUQ abdominal u/s ordered given persistent and severe abd pain. Ambulatory referral to GI given as pt has history of gastric ulcers. Follow-up in 3 months or sooner if needed.     , DO, [facility]  06/15/2022, 2:20 PM Office: [phone] Pager:  [phone]

## 2022-07-04 ENCOUNTER — Other Ambulatory Visit: Payer: Commercial Managed Care - HMO

## 2022-07-08 ENCOUNTER — Ambulatory Visit (INDEPENDENT_AMBULATORY_CARE_PROVIDER_SITE_OTHER): Payer: Managed Care, Other (non HMO) | Admitting: Family Medicine

## 2022-07-08 ENCOUNTER — Encounter: Payer: Self-pay | Admitting: Family Medicine

## 2022-07-08 VITALS — BP 142/80 | HR 70 | Temp 98.3°F | Ht 64.0 in | Wt 140.5 lb

## 2022-07-08 DIAGNOSIS — R739 Hyperglycemia, unspecified: Secondary | ICD-10-CM

## 2022-07-08 DIAGNOSIS — K219 Gastro-esophageal reflux disease without esophagitis: Secondary | ICD-10-CM

## 2022-07-08 DIAGNOSIS — I10 Essential (primary) hypertension: Secondary | ICD-10-CM

## 2022-07-08 DIAGNOSIS — E782 Mixed hyperlipidemia: Secondary | ICD-10-CM

## 2022-07-08 LAB — LIPID PANEL
Cholesterol: 311 mg/dL — ABNORMAL HIGH (ref 0–200)
HDL: 104.9 mg/dL (ref >39.00)
LDL Cholesterol: 195 mg/dL — ABNORMAL HIGH (ref 0–99)
NonHDL: 206.04
Total CHOL/HDL Ratio: 3
Triglycerides: 57 mg/dL (ref 0.0–149.0)
VLDL: 11.4 mg/dL (ref 0.0–40.0)

## 2022-07-08 LAB — HEMOGLOBIN A1C: Hgb A1c MFr Bld: 5.3 % (ref 4.6–6.5)

## 2022-07-08 MED ORDER — LOSARTAN POTASSIUM 25 MG PO TABS: 25.0000 mg | ORAL_TABLET | Freq: Every day | ORAL | 1 refills | Status: DC

## 2022-07-08 NOTE — Patient Instructions (Signed)
Check blood pressure every day at home, Your goal blood pressure is anything less then 140/ 90.

## 2022-07-08 NOTE — Progress Notes (Signed)
No diabetes but her bad cholesterol (LDL) is very elevated. I recommend starting a medication for this. Atorvastatin 40 mg daily- if patient is agreeable please let me know so I can call in

## 2022-07-08 NOTE — Progress Notes (Signed)
New Patient Office Visit  Subjective    Patient ID: Tasha Fox, female    DOB: [birth year]  Age: 61 y.o. MRN: [id number]  CC:  Chief Complaint  Patient presents with  . Establish Care    HPI Tasha Fox presents to establish care Patient was seen in the ER twice last month. Her symptoms included abdominal pain, nausea and vomiting, no fevers. She had a full work up including blood work and CT scans which were unremarkable. States she has an appointment with the cardiologist for a stress test. States she was placed on metoprolol but thinks that it is causing elevations in blood pressure. Pt reports that since the ER her symptoms have improved. States that she is eating well and having regular bowel movements. BP was elevated in today's visit, she reports she states she has never has high blood pressure before, does not have a BP cuff at home.   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 Had colonoscopy in 2017, she reports that she was told it was fine, repeat in 10 years.   Outpatient Encounter Medications as of 07/08/2022  Medication Sig  . losartan (COZAAR) 25 MG tablet Take 1 tablet (25 mg total) by mouth daily.  . metoprolol succinate (TOPROL XL) 25 MG 24 hr tablet Take 1 tablet (25 mg total) by mouth daily.  . pantoprazole (PROTONIX) 20 MG tablet Take 20 mg by mouth daily.   No facility-administered encounter medications on file as of 07/08/2022.    Past Medical History:  Diagnosis Date  . GERD (gastroesophageal reflux disease)   . History of stomach ulcers     Past Surgical History:  Procedure Laterality Date  . KNEE ARTHROSCOPY Right     Family History  Problem Relation Age of Onset  . Diabetes Mother   . Hypertension Brother     Social History   Socioeconomic History  . Marital status: Single    Spouse name: Not on file  . Number of children: Not on file  . Years of education: Not on file   . Highest education level: Not on file  Occupational History  . Not on file  Tobacco Use  . Smoking status: Former    Packs/day: 2.00    Years: 35.00    Total pack years: 70.00    Types: Cigarettes  . Smokeless tobacco: Never  Vaping Use  . Vaping Use: Some days  Substance and Sexual Activity  . Alcohol use: Yes    Comment: occassionally  . Drug use: Not Currently    Types: Marijuana  . Sexual activity: Not Currently  Other Topics Concern  . Not on file  Social History Narrative  . Not on file   Social Determinants of Health   Financial Resource Strain: Not on file  Food Insecurity: Not on file  Transportation Needs: Not on file  Physical Activity: Not on file  Stress: Not on file  Social Connections: Not on file  Intimate Partner Violence: Not on file    Review of Systems  All other systems reviewed and are negative.       Objective    BP (!) 142/80 (BP Location: Right Arm, Cuff Size: Normal)   Pulse 70   Temp 98.3 F (36.8 C) (Oral)   Ht 5\' 4"  (1.626 m)   Wt 140 lb 8 oz (63.7 kg)   LMP  (LMP Unknown)   SpO2 98%  BMI 24.12 kg/m   Physical Exam Vitals reviewed.  Constitutional:      Appearance: Normal appearance. She is well-groomed and normal weight.  HENT:     Head: Normocephalic and atraumatic.     Mouth/Throat:     Mouth: Mucous membranes are moist.     Pharynx: Oropharynx is clear.  Eyes:     Extraocular Movements: Extraocular movements intact.     Conjunctiva/sclera: Conjunctivae normal.     Pupils: Pupils are equal, round, and reactive to light.  Cardiovascular:     Rate and Rhythm: Normal rate and regular rhythm.     Pulses: Normal pulses.     Heart sounds: S1 normal and S2 normal.  Pulmonary:     Effort: Pulmonary effort is normal.     Breath sounds: Normal breath sounds and air entry.  Abdominal:     General: Abdomen is flat. Bowel sounds are normal.     Palpations: Abdomen is soft.  Musculoskeletal:        General: Normal  range of motion.     Cervical back: Normal range of motion and neck supple.     Right lower leg: No edema.     Left lower leg: No edema.  Skin:    General: Skin is warm and dry.  Neurological:     Mental Status: She is alert and oriented to person, place, and time. Mental status is at baseline.     Gait: Gait is intact.  Psychiatric:        Mood and Affect: Mood and affect normal.        Speech: Speech normal.        Behavior: Behavior normal.        Judgment: Judgment normal.    {Labs (Optional):23779}    Assessment & Plan:   Problem List Items Addressed This Visit       Cardiovascular and Mediastinum   Essential hypertension - Primary   Relevant Medications   losartan (COZAAR) 25 MG tablet   Other Relevant Orders   Lipid Panel     Digestive   GERD (gastroesophageal reflux disease)   Relevant Medications   pantoprazole (PROTONIX) 20 MG tablet   Other Visit Diagnoses     Hyperglycemia       Relevant Orders   Hemoglobin A1c       No follow-ups on file.   Farrel Conners, MD

## 2022-07-11 ENCOUNTER — Telehealth: Payer: Self-pay | Admitting: Family Medicine

## 2022-07-11 NOTE — Telephone Encounter (Signed)
Pt is returning Tasha Fox call concerning blood work result 

## 2022-07-11 NOTE — Assessment & Plan Note (Addendum)
BP was elevated in the office, will add losartan 25 mg daily and I advised the patient to obtain a BP cuff and check her BP daily. I will see her back short term to recheck her BP and adjust medication if needed. Continue the metoprolol 25 mg daily. Will order a lipid panel and A1C to rule out comorbid conditions. Patient is a smoker so if her cholesterol is elevated she will need a statin to reduce her risk of CAD, she has an appt with cardiology for stress test soon.

## 2022-07-12 NOTE — Telephone Encounter (Signed)
See results note. 

## 2022-07-14 MED ORDER — ATORVASTATIN CALCIUM 40 MG PO TABS: 40.0000 mg | ORAL_TABLET | Freq: Every day | ORAL | 3 refills | Status: DC

## 2022-07-14 NOTE — Addendum Note (Signed)
Addended by: Farrel Conners on: 07/14/2022 09:40 AM   Modules accepted: Orders

## 2022-07-19 ENCOUNTER — Ambulatory Visit: Payer: Commercial Managed Care - HMO

## 2022-07-19 DIAGNOSIS — I10 Essential (primary) hypertension: Secondary | ICD-10-CM

## 2022-07-19 DIAGNOSIS — R079 Chest pain, unspecified: Secondary | ICD-10-CM

## 2022-07-19 DIAGNOSIS — R1011 Right upper quadrant pain: Secondary | ICD-10-CM

## 2022-07-25 ENCOUNTER — Ambulatory Visit: Payer: Commercial Managed Care - HMO

## 2022-07-25 DIAGNOSIS — R1011 Right upper quadrant pain: Secondary | ICD-10-CM

## 2022-07-25 DIAGNOSIS — I10 Essential (primary) hypertension: Secondary | ICD-10-CM

## 2022-07-25 DIAGNOSIS — R079 Chest pain, unspecified: Secondary | ICD-10-CM

## 2022-07-26 ENCOUNTER — Ambulatory Visit (INDEPENDENT_AMBULATORY_CARE_PROVIDER_SITE_OTHER): Payer: Managed Care, Other (non HMO) | Admitting: Gastroenterology

## 2022-07-26 ENCOUNTER — Encounter: Payer: Self-pay | Admitting: Gastroenterology

## 2022-07-26 VITALS — BP 122/72 | HR 80 | Ht 64.0 in | Wt 139.0 lb

## 2022-07-26 DIAGNOSIS — R0789 Other chest pain: Secondary | ICD-10-CM

## 2022-07-26 DIAGNOSIS — R933 Abnormal findings on diagnostic imaging of other parts of digestive tract: Secondary | ICD-10-CM

## 2022-07-26 MED ORDER — PANTOPRAZOLE SODIUM 40 MG PO TBEC: 40.0000 mg | DELAYED_RELEASE_TABLET | Freq: Every day | ORAL | 3 refills | Status: DC

## 2022-07-26 NOTE — Patient Instructions (Signed)
Continue Pantoprazole.   You have been scheduled for an endoscopy. Please follow written instructions given to you at your visit today. If you use inhalers (even only as needed), please bring them with you on the day of your procedure.  _______________________________________________________  If you are age 59 or older, your body mass index should be between 23-30. Your Body mass index is 23.86 kg/m. If this is out of the aforementioned range listed, please consider follow up with your Primary Care Provider.  If you are age 1 or younger, your body mass index should be between 19-25. Your Body mass index is 23.86 kg/m. If this is out of the aformentioned range listed, please consider follow up with your Primary Care Provider.   ________________________________________________________  The [facility] providers would like to encourage you to use [facility] to communicate with providers for non-urgent requests or questions.  Due to long hold times on the telephone, sending your provider a message by [facility] may be a faster and more efficient way to get a response.  Please allow 48 business hours for a response.  Please remember that this is for non-urgent requests.  _______________________________________________________

## 2022-07-26 NOTE — Progress Notes (Addendum)
Referring Provider: Karie Georges, MD Primary Care Physician:  Karie Georges, MD   Reason for Consultation: Abdominal pain   IMPRESSION:  Abnormal stomach on CT 2022 Atypical chest pain RUQ pain Rare NSAIDs  PLAN: - EGD to evaluate for esophagitis, gastritis, PUD, and H pylori - Continue pantroprazole - Avoid NSAIDs - Consider esophageal manometry if EGD is non-diagnostic - Colonoscopy report from [facility]   HPI: Tasha Fox is a 61 y.o. female referred by Dr. Melton Alar for evaluation of atypical chest pain and RUQ pain. The history is obtained through the patient and review of her electronic health record.  She has a history of hypertension, history of gastric ulcers and chest pain. Her daughter accompanies her to this appointment. She works at [employer].  Seen in the ER three times in September for right upper quadrant abdominal pain/cramping, nausea, vomiting, and diarrhea. The pain often goes around to her back. Labs and a CT scan were unremarkable.     Seen by cardiologist, Dr. Melton Alar 06/15/2022, ultrasound ordered as well as an echo and stress test.  Echo showed LVEF 60-65% and mild MR with trace TR. She was encouraged to follow-up with GI given history of gastric ulcers.  Rare NSAIDs. No Goody's in years.   Prior abdominal imaging: -Abdominal x-ray 07/01/19/2021: Normal -CT of the abdomen pelvis with contrast 01/29/2021: Asymmetric wall thickening of the gastric antrum, fibroid uterus -CTA of the chest abdomen and pelvis 06/12/2022 shows no acute findings, emphysema, punctate left upper lobe pulmonary nodule -Abdominal ultrasound 06/15/2022 shows echogenic liver, otherwise normal  Colonoscopy in [place] several years ago.  No prior EGD.   There is no known family history of colon cancer or polyps. No family history of stomach cancer or other GI malignancy. No family history of inflammatory bowel disease or celiac.    Past Medical History:   Diagnosis Date   GERD (gastroesophageal reflux disease)    History of stomach ulcers     Past Surgical History:  Procedure Laterality Date   KNEE ARTHROSCOPY Right      Current Outpatient Medications  Medication Sig Dispense Refill   atorvastatin (LIPITOR) 40 MG tablet Take 1 tablet (40 mg total) by mouth daily. 90 tablet 3   losartan (COZAAR) 25 MG tablet Take 1 tablet (25 mg total) by mouth daily. 90 tablet 1   metoprolol succinate (TOPROL XL) 25 MG 24 hr tablet Take 1 tablet (25 mg total) by mouth daily. 30 tablet 3   pantoprazole (PROTONIX) 40 MG tablet Take 1 tablet (40 mg total) by mouth 2 (two) times daily before a meal. 90 tablet 3   No current facility-administered medications for this visit.    Allergies as of 07/26/2022 - Review Complete 07/08/2022  Allergen Reaction Noted   Sulfa antibiotics Swelling 01/13/2014   Sulfamethoxazole-trimethoprim Swelling 06/02/2011    Family History  Problem Relation Age of Onset   Diabetes Mother    Hypertension Brother    Stomach cancer Neg Hx    Esophageal cancer Neg Hx    Colon cancer Neg Hx     Social History   Socioeconomic History   Marital status: Single    Spouse name: Not on file   Number of children: 2   Years of education: Not on file   Highest education level: Not on file  Occupational History   Occupation: [employer]  Tobacco Use   Smoking status: Former    Packs/day: 2.00    Years: 35.00  Total pack years: 70.00    Types: Cigarettes   Smokeless tobacco: Never  Vaping Use   Vaping Use: Every day  Substance and Sexual Activity   Alcohol use: Yes    Comment: occassionally   Drug use: Not Currently    Types: Marijuana   Sexual activity: Not Currently  Other Topics Concern   Not on file  Social History Narrative   Not on file   Social Determinants of Health   Financial Resource Strain: Not on file  Food Insecurity: Not on file  Transportation Needs: Not on file  Physical Activity: Not on file   Stress: Not on file  Social Connections: Not on file  Intimate Partner Violence: Not on file    Review of Systems: 12 system ROS is negative except as noted above.   Physical Exam: General:   Alert,  well-nourished, pleasant and cooperative in NAD Head:  Normocephalic and atraumatic. Eyes:  Sclera clear, no icterus.   Conjunctiva pink. Ears:  Normal auditory acuity. Nose:  No deformity, discharge,  or lesions. Mouth:  No deformity or lesions.   Neck:  Supple; no masses or thyromegaly. Lungs:  Clear throughout to auscultation.   No wheezes. Heart:  Regular rate and rhythm; no murmurs. Abdomen:  Soft, nontender, nondistended, normal bowel sounds, no rebound or guarding. No hepatosplenomegaly.   Rectal:  Deferred  Msk:  Symmetrical. No boney deformities LAD: No inguinal or umbilical LAD Extremities:  No clubbing or edema. Neurologic:  Alert and  oriented x4;  grossly nonfocal Skin:  Intact without significant lesions or rashes. Psych:  Alert and cooperative. Normal mood and affect.    Radames Mejorado L. Tarri Glenn, MD, MPH 08/07/2022, 6:38 PM

## 2022-08-05 ENCOUNTER — Telehealth: Payer: Self-pay | Admitting: *Deleted

## 2022-08-05 ENCOUNTER — Encounter: Payer: Self-pay | Admitting: Gastroenterology

## 2022-08-05 ENCOUNTER — Ambulatory Visit: Payer: Managed Care, Other (non HMO) | Admitting: Gastroenterology

## 2022-08-05 VITALS — BP 140/76 | HR 68 | Temp 96.0°F | Resp 15 | Ht 64.0 in | Wt 139.0 lb

## 2022-08-05 DIAGNOSIS — R933 Abnormal findings on diagnostic imaging of other parts of digestive tract: Secondary | ICD-10-CM

## 2022-08-05 DIAGNOSIS — K2970 Gastritis, unspecified, without bleeding: Secondary | ICD-10-CM

## 2022-08-05 DIAGNOSIS — K2950 Unspecified chronic gastritis without bleeding: Secondary | ICD-10-CM | POA: Diagnosis present

## 2022-08-05 DIAGNOSIS — B9681 Helicobacter pylori [H. pylori] as the cause of diseases classified elsewhere: Secondary | ICD-10-CM

## 2022-08-05 DIAGNOSIS — K2951 Unspecified chronic gastritis with bleeding: Secondary | ICD-10-CM

## 2022-08-05 MED ORDER — PANTOPRAZOLE SODIUM 40 MG PO TBEC: 40.0000 mg | DELAYED_RELEASE_TABLET | Freq: Two times a day (BID) | ORAL | 3 refills | Status: DC

## 2022-08-05 MED ORDER — SODIUM CHLORIDE 0.9 % IV SOLN: 500.0000 mL | Freq: Once | INTRAVENOUS | Status: DC

## 2022-08-05 NOTE — Progress Notes (Signed)
Indication for EGD: Abnormal stomach on CT earlier this year showing asymmetric thickening in the antrum, abdominal pain, nausea, and vomiting  Please see my 07/26/2022 office note for complete details.  There is been no change in history or physical exam since that time.  Patient remains an appropriate candidate for monitored anesthesia care in the endoscopy unit.

## 2022-08-05 NOTE — Telephone Encounter (Signed)
EGD instructions reviewed with  patient and daughter.

## 2022-08-05 NOTE — Patient Instructions (Addendum)
Thank you for letting us take care of your healthcare needs today Please see handouts given to you on Gastritis. New Rx for Protonix twice a day before meals sent to your Pharmacy.      YOU HAD AN ENDOSCOPIC PROCEDURE TODAY AT THE [facility]:   Refer to the procedure report that was given to you for any specific questions about what was found during the examination.  If the procedure report does not answer your questions, please call your gastroenterologist to clarify.  If you requested that your care partner not be given the details of your procedure findings, then the procedure report has been included in a sealed envelope for you to review at your convenience later.  YOU SHOULD EXPECT: Some feelings of bloating in the abdomen. Passage of more gas than usual.  Walking can help get rid of the air that was put into your GI tract during the procedure and reduce the bloating. If you had a lower endoscopy (such as a colonoscopy or flexible sigmoidoscopy) you may notice spotting of blood in your stool or on the toilet paper. If you underwent a bowel prep for your procedure, you may not have a normal bowel movement for a few days.  Please Note:  You might notice some irritation and congestion in your nose or some drainage.  This is from the oxygen used during your procedure.  There is no need for concern and it should clear up in a day or so.  SYMPTOMS TO REPORT IMMEDIATELY:  Following upper endoscopy (EGD)  Vomiting of blood or coffee ground material  New chest pain or pain under the shoulder blades  Painful or persistently difficult swallowing  New shortness of breath  Fever of 100F or higher  Black, tarry-looking stools  For urgent or emergent issues, a gastroenterologist can be reached at any hour by calling [phone]. Do not use MyChart messaging for urgent concerns.    DIET:  We do recommend a small meal at first, but then you may proceed to your regular diet.  Drink  plenty of fluids but you should avoid alcoholic beverages for 24 hours.  ACTIVITY:  You should plan to take it easy for the rest of today and you should NOT DRIVE or use heavy machinery until tomorrow (because of the sedation medicines used during the test).    FOLLOW UP: Our staff will call the number listed on your records the next business day following your procedure.  We will call around 7:15- 8:00 am to check on you and address any questions or concerns that you may have regarding the information given to you following your procedure. If we do not reach you, we will leave a message.     If any biopsies were taken you will be contacted by phone or by letter within the next 1-3 weeks.  Please call us at [phone] if you have not heard about the biopsies in 3 weeks.    SIGNATURES/CONFIDENTIALITY: You and/or your care partner have signed paperwork which will be entered into your electronic medical record.  These signatures attest to the fact that that the information above on your After Visit Summary has been reviewed and is understood.  Full responsibility of the confidentiality of this discharge information lies with you and/or your care-partner.

## 2022-08-05 NOTE — Progress Notes (Signed)
Called to room to assist during endoscopic procedure.  Patient ID and intended procedure confirmed with present staff. Received instructions for my participation in the procedure from the performing physician.  

## 2022-08-05 NOTE — Progress Notes (Signed)
To pacu, VSS. Report to [email or web address] 

## 2022-08-05 NOTE — Progress Notes (Signed)
Vitals-NS  Pt's states no medical or surgical changes since previsit or office visit. 

## 2022-08-05 NOTE — Progress Notes (Signed)
PT scheduled for EGD on 10/11/22 to check for healing.

## 2022-08-05 NOTE — Op Note (Addendum)
[facility] Patient Name: Tasha Fox Procedure Date: 08/05/2022 7:29 AM MRN: [id number] Endoscopist: Tressia Danas MD, MD, [id number] Age: 61 Referring MD:  Date of Birth: July 13, 1961 Gender: Female Account #: [id number] Procedure:                Upper GI endoscopy Indications:              Abdominal pain, Abnormal CT of the GI tract, Nausea                            with vomiting Medicines:                Monitored Anesthesia Care Procedure:                Pre-Anesthesia Assessment:                           - Prior to the procedure, a History and Physical                            was performed, and patient medications and                            allergies were reviewed. The patient's tolerance of                            previous anesthesia was also reviewed. The risks                            and benefits of the procedure and the sedation                            options and risks were discussed with the patient.                            All questions were answered, and informed consent                            was obtained. Prior Anticoagulants: The patient has                            taken no anticoagulant or antiplatelet agents. ASA                            Grade Assessment: II - A patient with mild systemic                            disease. After reviewing the risks and benefits,                            the patient was deemed in satisfactory condition to                            undergo the procedure.  After obtaining informed consent, the endoscope was                            passed under direct vision. Throughout the                            procedure, the patient's blood pressure, pulse, and                            oxygen saturations were monitored continuously. The                            Endoscope was introduced through the mouth, and                            advanced to the third part of  duodenum. The upper                            GI endoscopy was accomplished without difficulty.                            The patient tolerated the procedure well. Scope In: Scope Out: Findings:                 The examined esophagus was normal. the z-line is                            located 38 cm from the incisors.                           The area of the antrum/prepylorus is abnormally                            bulbous. There is no obvious mass or nodule. The                            mucosa in this area is extremely erythematous. No                            discrete ulcer seen. Patchy moderate inflammation                            characterized by erythema, friability and                            granularity was found in the gastric body and in                            the gastric antrum, as well. Biopsies were taken                            from the prepylorus, antum, body, and fundus  including tunneled biopsies from the antrum with a                            cold forceps for histology. Estimated blood loss                            was minimal.                           Patchy mild mucosal changes characterized by a                            decreased vascular pattern were found in the entire                            duodenum. Biopsies were taken with a cold forceps                            for histology. Estimated blood loss was minimal. Complications:            No immediate complications. Estimated Blood Loss:     Estimated blood loss was minimal. Impression:               - Normal esophagus.                           - Abnormal antrum. Bulbous appearing. No obvious                            mass. Suspected location of prior ulcer. Must                            consider a submucous process.                           - Gastritis. Biopsied.                           - Mucosal changes in the duodenum. Biopsied. Recommendation:            - Patient has a contact number available for                            emergencies. The signs and symptoms of potential                            delayed complications were discussed with the                            patient. Return to normal activities tomorrow.                            Written discharge instructions were provided to the                            patient.                           -  Resume previous diet.                           - Continue present medications. Increase                            pantoprazole to 40 mg twice daily.                           - Await pathology results.                           - No aspirin, ibuprofen, naproxen, or other                            non-steroidal anti-inflammatory drugs.                           - Repeat upper endoscopy to check healing in 10                            weeks to reassess the antrum. Tressia Danas MD, MD 08/05/2022 8:09:37 AM This report has been signed electronically.

## 2022-08-07 ENCOUNTER — Encounter: Payer: Self-pay | Admitting: Gastroenterology

## 2022-08-08 ENCOUNTER — Telehealth: Payer: Self-pay

## 2022-08-08 NOTE — Telephone Encounter (Signed)
Left message on answering machine. 

## 2022-08-12 ENCOUNTER — Other Ambulatory Visit: Payer: Self-pay

## 2022-08-12 MED ORDER — BISMUTH/METRONIDAZ/TETRACYCLIN 140-125-125 MG PO CAPS: 3.0000 | ORAL_CAPSULE | Freq: Three times a day (TID) | ORAL | 0 refills | Status: DC

## 2022-08-21 IMAGING — CT CT ABD-PELV W/ CM
2 of 5 series · 16 of 46 positions shown, 18 images · IV contrast (APPLIED)
Comparison: None.

CLINICAL DATA: Abdominal pain.

EXAM:
CT ABDOMEN AND PELVIS WITH CONTRAST
TECHNIQUE: Multidetector CT imaging of the abdomen and pelvis was performed
using the standard protocol following bolus administration of
intravenous contrast.
CONTRAST:  75mL OMNIPAQUE IOHEXOL 350 MG/ML SOLN

[Series 2: abd pel w · axial · 0.63mm/px · z∈[+826,+1200]mm · 13 of 87 slices shown, 15 images]
[im 6/87  soft-tissue]
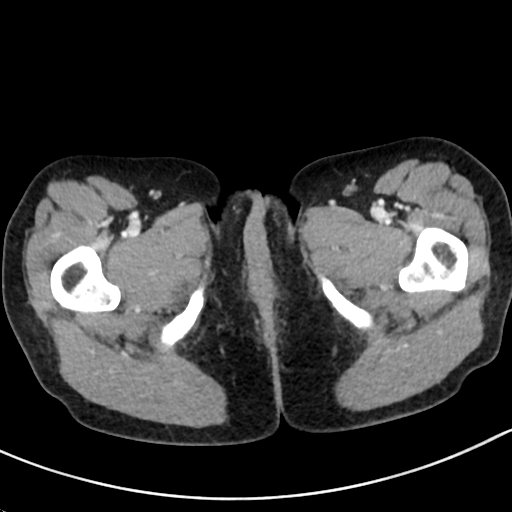
[im 6/87  bone]
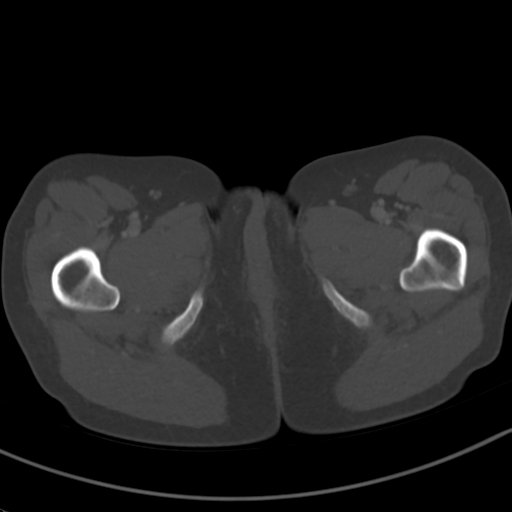
[im 11/87  soft-tissue]
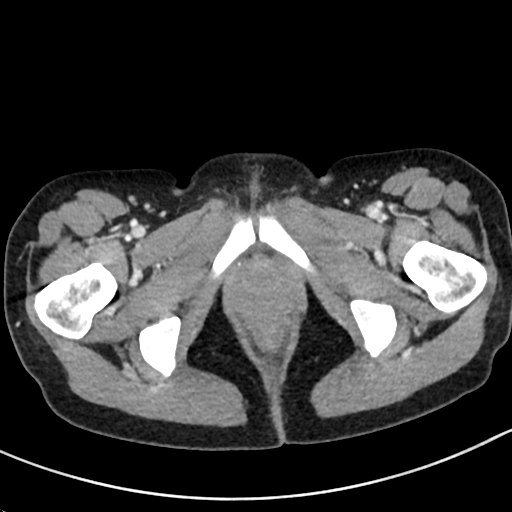
[im 21/87  soft-tissue]
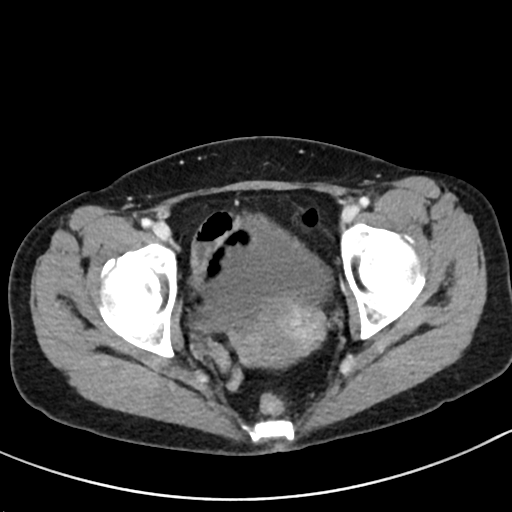
[im 26/87  soft-tissue]
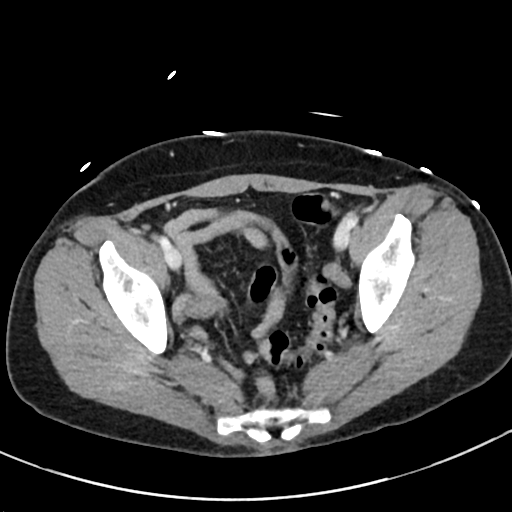
[im 31/87  soft-tissue]
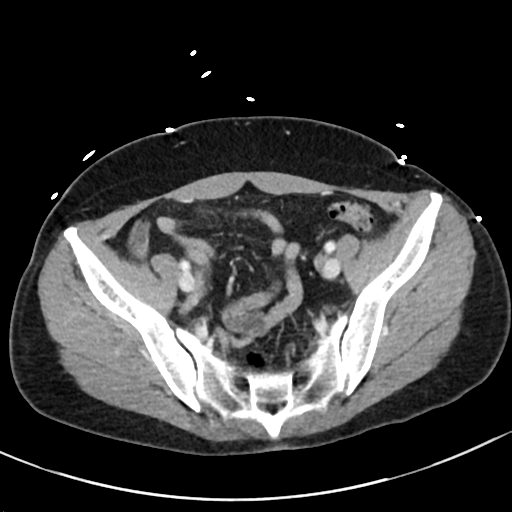
[im 36/87  soft-tissue]
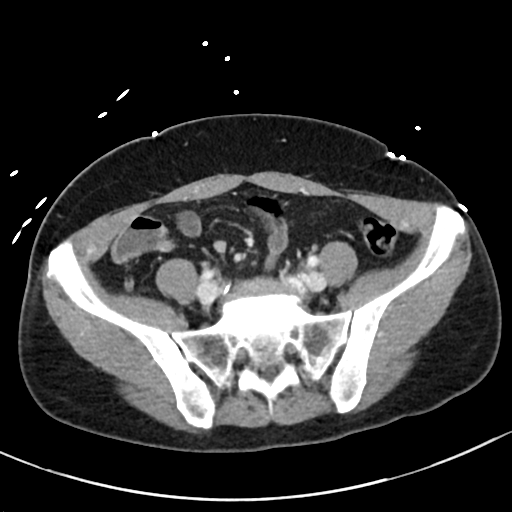
[im 46/87  soft-tissue]
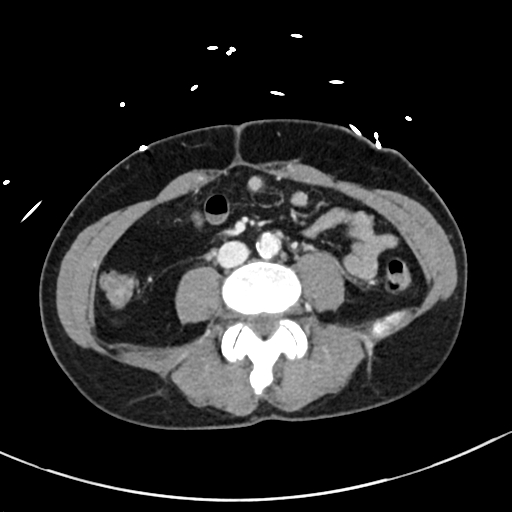
[im 51/87  soft-tissue]
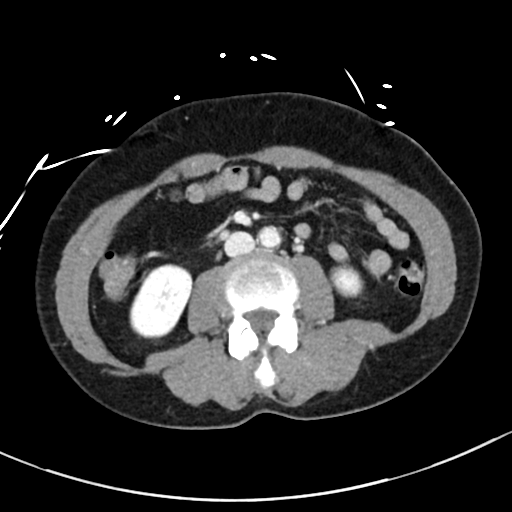
[im 56/87  soft-tissue]
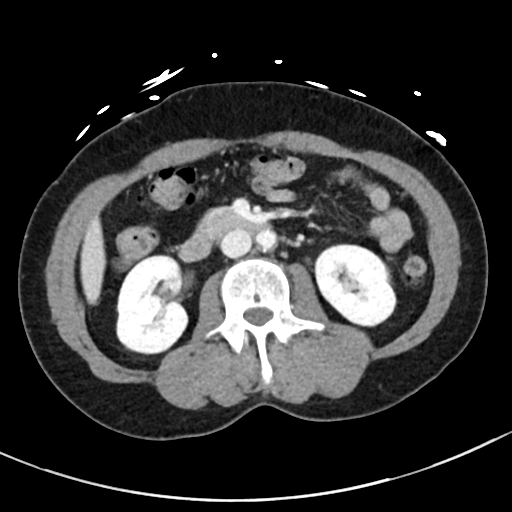
[im 56/87  bone]
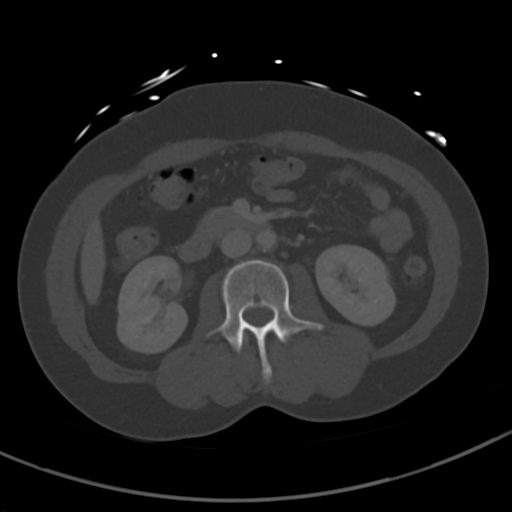
[im 61/87  soft-tissue]
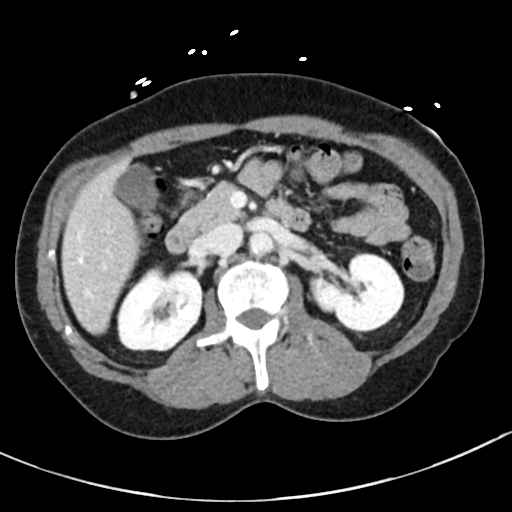
[im 66/87  soft-tissue]
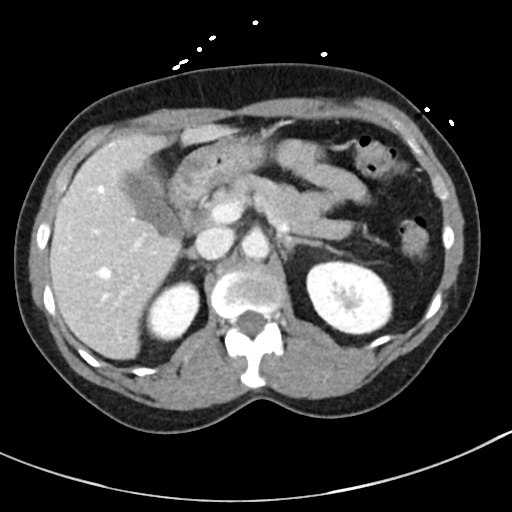
[im 76/87  soft-tissue]
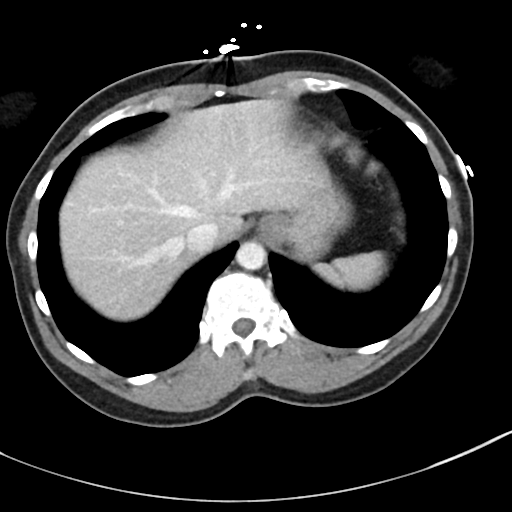
[im 81/87  soft-tissue]
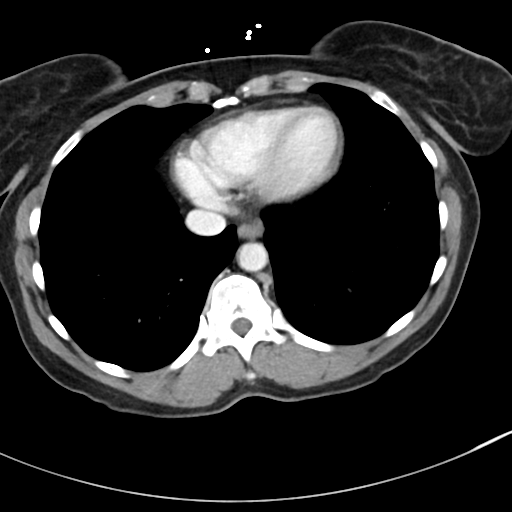

[Series 5: coronal · coronal · 0.61mm/px · 3 of 92 slices shown]
[im 31/92  soft-tissue]
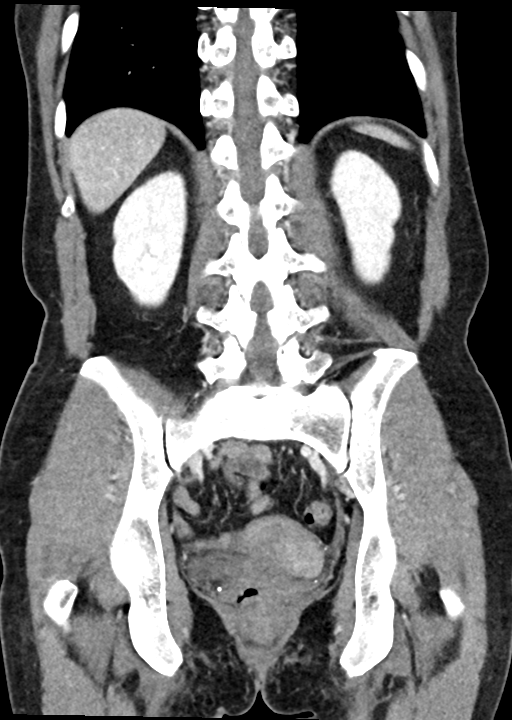
[im 41/92  soft-tissue]
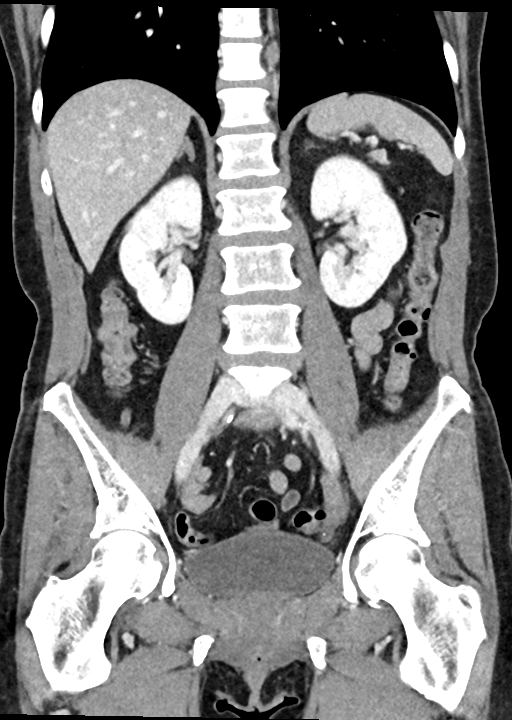
[im 51/92  soft-tissue]
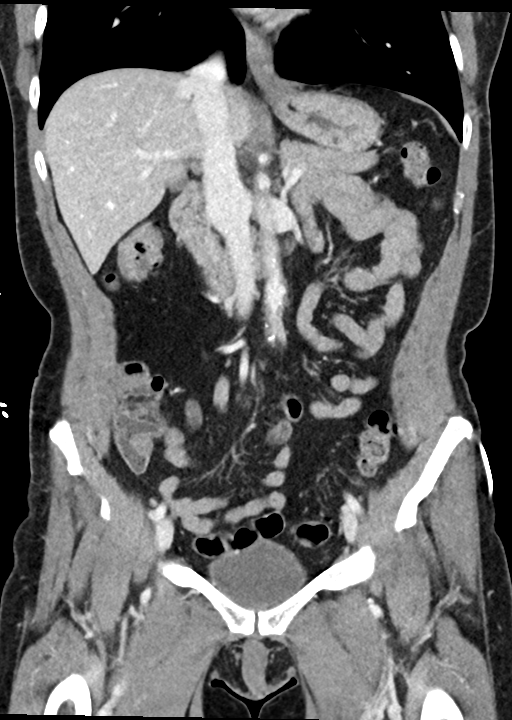

[16 of 46 positions shown; findings below may reference images not displayed]

FINDINGS: Lower chest: No acute abnormality.

Hepatobiliary: 1.0 cm simple cyst in the central hepatic dome.
Punctate calcified granuloma along the gallbladder fossa. The
gallbladder is unremarkable. No biliary dilatation.

Pancreas: Unremarkable. No pancreatic ductal dilatation or
surrounding inflammatory changes.

Spleen: Normal in size without focal abnormality.

Adrenals/Urinary Tract: Adrenal glands are unremarkable. Kidneys are
normal, without renal calculi, focal lesion, or hydronephrosis.
Bladder is unremarkable.

Stomach/Bowel: Asymmetric wall thickening of the gastric antrum.
Appendix appears normal. No evidence of bowel wall thickening,
distention, or inflammatory changes.

Vascular/Lymphatic: Aortic atherosclerosis. No enlarged abdominal or
pelvic lymph nodes.

Reproductive: 2.7 cm uterine fibroid.  No adnexal mass.

Other: No abdominal wall hernia or abnormality. No abdominopelvic
ascites. No pneumoperitoneum.

Musculoskeletal: No acute or significant osseous findings.
IMPRESSION: 1. Asymmetric wall thickening of the gastric antrum may reflect
gastritis.
2. Fibroid uterus.
3. Aortic Atherosclerosis ([id number]).

## 2022-09-07 ENCOUNTER — Ambulatory Visit (INDEPENDENT_AMBULATORY_CARE_PROVIDER_SITE_OTHER): Payer: Managed Care, Other (non HMO) | Admitting: Family Medicine

## 2022-09-07 ENCOUNTER — Encounter: Payer: Self-pay | Admitting: Family Medicine

## 2022-09-07 VITALS — BP 128/82 | HR 95 | Temp 98.3°F | Ht 64.0 in | Wt 145.8 lb

## 2022-09-07 DIAGNOSIS — M545 Low back pain, unspecified: Secondary | ICD-10-CM

## 2022-09-07 DIAGNOSIS — I10 Essential (primary) hypertension: Secondary | ICD-10-CM

## 2022-09-07 DIAGNOSIS — G8929 Other chronic pain: Secondary | ICD-10-CM | POA: Diagnosis not present

## 2022-09-07 DIAGNOSIS — G5603 Carpal tunnel syndrome, bilateral upper limbs: Secondary | ICD-10-CM | POA: Diagnosis not present

## 2022-09-07 MED ORDER — CYCLOBENZAPRINE HCL 10 MG PO TABS: 5.0000 mg | ORAL_TABLET | Freq: Three times a day (TID) | ORAL | 2 refills | Status: DC | PRN

## 2022-09-07 MED ORDER — IBUPROFEN 800 MG PO TABS: 800.0000 mg | ORAL_TABLET | Freq: Three times a day (TID) | ORAL | 2 refills | Status: DC | PRN

## 2022-09-07 MED ORDER — HYDROCHLOROTHIAZIDE 12.5 MG PO TABS: 12.5000 mg | ORAL_TABLET | Freq: Every day | ORAL | 3 refills | Status: DC

## 2022-09-07 NOTE — Assessment & Plan Note (Signed)
Pt will start ibuprofen 800 mg TID prn back pain, this will also help with her wrists as well. I have written her a script for wrist splints that she will start wearing at night. If this does not improve her numbness then she will need a referral to a hand specialist.

## 2022-09-07 NOTE — Progress Notes (Signed)
Established Patient Office Visit  Subjective   Patient ID: Tasha Fox, female    DOB: 11-27-1960  Age: 61 y.o. MRN: [id number]  Chief Complaint  Patient presents with   Follow-up   patient complains of "tightness" in her body    Pt is here today for follow up of her blood pressure. Last visit she was stated on losartan 25 mg daily and was continued on the metoprolol 25 mg daily. She underwent stress test and ECHO on 07/19/22 which I reviewed and both tests were normal.  Patient reports that she is not taking the losartan every day, states she is only taking it when she "feels funny" pt reports that her BP at home has been running 140/80 this morning. States that it made her feel swollen and tight. Has essentially stopped taking the metoprolol as well.   Pt is reporting that she needs "work done in her mouth". States that she saw a dentist and was told she needed some teeth pulled. States that she  Patient is reporting that she is having numbness in both her hands, states that this has been going on for a while, states that at night, states she feels like they are falling asleep. Occasional symptoms during the day as well.   Pt is also reporting occasional "waviness in her eyesight", states that it happens in both her eyes, states there is no pain, states that it is random, comes and goes. No blurry or double vision, no headaches or other symptoms with the changes in eyesight. States that she has seen an eye doctor in the past who did a work up however nothing abnormal was found per the patient's report.  Patient is reporting chronic low back pain, states that it is  on both sides, states that it is above the hips across the lower back. States that it is constant, does not improve with laying down, describes it as a constant soreness, reports no changes in range of motion, no stiffness, no radiation of the pain.    Current Outpatient Medications  Medication Instructions    atorvastatin (LIPITOR) 40 mg, Oral, Daily   Bismuth/Metronidaz/Tetracyclin (PYLERA) 140-125-125 MG CAPS 3 capsules, Oral, 3 times daily before meals & bedtime   cyclobenzaprine (FLEXERIL) 5-10 mg, Oral, 3 times daily PRN   hydrochlorothiazide (HYDRODIURIL) 12.5 mg, Oral, Daily   ibuprofen (ADVIL) 800 mg, Oral, Every 8 hours PRN   losartan (COZAAR) 25 mg, Oral, Daily   pantoprazole (PROTONIX) 40 mg, Oral, 2 times daily before meals     Patient Active Problem List   Diagnosis Date Noted   Carpal tunnel syndrome, bilateral 09/07/2022   GERD (gastroesophageal reflux disease)    Right upper quadrant abdominal pain 06/15/2022   Chest pain of uncertain etiology 06/15/2022   Essential hypertension 06/15/2022   Unilateral primary osteoarthritis, right knee 05/07/2020      Review of Systems  All other systems reviewed and are negative.     Objective:     BP 128/82 (BP Location: Left Arm, Patient Position: Sitting, Cuff Size: Normal)   Pulse 95   Temp 98.3 F (36.8 C) (Oral)   Ht 5\' 4"  (1.626 m)   Wt 145 lb 12.8 oz (66.1 kg)   LMP  (LMP Unknown)   SpO2 95%   BMI 25.03 kg/m  BP Readings from Last 3 Encounters:  09/07/22 128/82  08/05/22 (!) 140/76  07/26/22 122/72      Physical Exam Vitals reviewed.  Constitutional:  Appearance: Normal appearance. She is well-groomed and normal weight.  Eyes:     Conjunctiva/sclera: Conjunctivae normal.  Neck:     Thyroid: No thyromegaly.  Cardiovascular:     Rate and Rhythm: Normal rate and regular rhythm.     Pulses: Normal pulses.     Heart sounds: S1 normal and S2 normal.  Pulmonary:     Effort: Pulmonary effort is normal.     Breath sounds: Normal breath sounds and air entry.  Abdominal:     General: Bowel sounds are normal.  Musculoskeletal:     Right lower leg: No edema.     Left lower leg: No edema.  Neurological:     Mental Status: She is alert and oriented to person, place, and time. Mental status is at baseline.      Gait: Gait is intact.  Psychiatric:        Mood and Affect: Mood and affect normal.        Speech: Speech normal.        Behavior: Behavior normal.        Judgment: Judgment normal.      No results found for any visits on 09/07/22.    The ASCVD Risk score (Arnett DK, et al., 2019) failed to calculate for the following reasons:   The valid HDL cholesterol range is 20 to 100 mg/dL    Assessment & Plan:   Problem List Items Addressed This Visit       Unprioritized   Essential hypertension    I rechecked her BP in the office today and it was significantly elevated, 160/95. I discussed that we could stop the metoprolol since it is most likely to cause the tiredness and the swelling feeling. I advised that she continue the losartan and I will rx hydrochlorothiazide 12.5 mg daily to take with the losartan. I will have her return to the office in 3 months for a BP recheck.      Relevant Medications   hydrochlorothiazide (HYDRODIURIL) 12.5 MG tablet   Carpal tunnel syndrome, bilateral    Pt will start ibuprofen 800 mg TID prn back pain, this will also help with her wrists as well. I have written her a script for wrist splints that she will start wearing at night. If this does not improve her numbness then she will need a referral to a hand specialist.       Relevant Medications   cyclobenzaprine (FLEXERIL) 10 MG tablet   Other Visit Diagnoses     Chronic bilateral low back pain without sciatica    -  Primary   Relevant Medications   Given her description is it most likely MSK in origin, there is no radiculopathy at this time. Recommended stretching (handout given to patient) and the use of ibuprofen 800 mg TID PRN and cyclobenzaprine 5-10 mg every 8 hours PRN muscle spasms. If no improvement with conservative treatment then will send to PT.  ibuprofen (ADVIL) 800 MG tablet   cyclobenzaprine (FLEXERIL) 10 MG tablet       Return in about 3 months (around 12/07/2022).     Karie Georges, MD

## 2022-09-07 NOTE — Assessment & Plan Note (Signed)
I rechecked her BP in the office today and it was significantly elevated, 160/95. I discussed that we could stop the metoprolol since it is most likely to cause the tiredness and the swelling feeling. I advised that she continue the losartan and I will rx hydrochlorothiazide 12.5 mg daily to take with the losartan. I will have her return to the office in 3 months for a BP recheck.

## 2022-09-14 ENCOUNTER — Ambulatory Visit: Payer: Commercial Managed Care - HMO | Admitting: Internal Medicine

## 2022-09-14 ENCOUNTER — Encounter: Payer: Self-pay | Admitting: Internal Medicine

## 2022-09-14 VITALS — BP 150/77 | HR 85 | Ht 64.0 in | Wt 147.2 lb

## 2022-09-14 DIAGNOSIS — E782 Mixed hyperlipidemia: Secondary | ICD-10-CM

## 2022-09-14 DIAGNOSIS — R079 Chest pain, unspecified: Secondary | ICD-10-CM

## 2022-09-14 DIAGNOSIS — I10 Essential (primary) hypertension: Secondary | ICD-10-CM

## 2022-09-14 MED ORDER — METOPROLOL SUCCINATE ER 25 MG PO TB24: 25.0000 mg | ORAL_TABLET | Freq: Every day | ORAL | 3 refills | Status: DC

## 2022-09-14 NOTE — Progress Notes (Signed)
Primary Physician/Referring:  Karie Georges, MD  Patient ID: Tasha Fox, female    DOB: January 14, 1961, 61 y.o.   MRN: [id number]  Chief Complaint  Patient presents with   Chest Pain   Follow-up   HPI:    Tasha Fox  is a 61 y.o. female with hypertension and hyperlipidemia who is here for a follow-up visit. Patient has still been having pain everywhere. She is having diffuse pain that sounds like fibromyalgia. Patient has multiple sites of pain throughout her body and it is occurring on most days. Patient states this happens even when she does not do much. She feels tired even when she does not move around a lot. At rest, she denies chest pain, shortness of breath, palpitations, diaphoresis, syncope, edema, PND, orthopnea.   Past Medical History:  Diagnosis Date   GERD (gastroesophageal reflux disease)    History of stomach ulcers    Past Surgical History:  Procedure Laterality Date   KNEE ARTHROSCOPY Right    Family History  Problem Relation Age of Onset   Diabetes Mother    Hypertension Brother    Stomach cancer Neg Hx    Esophageal cancer Neg Hx    Colon cancer Neg Hx     Social History   Tobacco Use   Smoking status: Former    Packs/day: 2.00    Years: 35.00    Total pack years: 70.00    Types: Cigarettes   Smokeless tobacco: Never  Substance Use Topics   Alcohol use: Yes    Comment: occassionally   Marital Status: Single  ROS  Review of Systems  Cardiovascular:  Negative for chest pain.  Musculoskeletal:  Positive for back pain, joint pain and myalgias.  Gastrointestinal:  Negative for abdominal pain and heartburn.   Objective  Blood pressure (!) 150/77, pulse 85, height 5\' 4"  (1.626 m), weight 147 lb 3.2 oz (66.8 kg), SpO2 96 %. Body mass index is 25.27 kg/m.     09/14/2022    8:46 AM 09/07/2022   10:39 AM 08/05/2022    8:15 AM  Vitals with BMI  Height 5\' 4"  5\' 4"    Weight 147 lbs 3 oz 145 lbs 13 oz   BMI 25.25 25.01    Systolic 150 128 13/06/2022  Diastolic 77 82 76  Pulse 85 95 68     Physical Exam Vitals and nursing note reviewed.  Cardiovascular:     Rate and Rhythm: Normal rate and regular rhythm.     Pulses: Normal pulses.     Heart sounds: Normal heart sounds. No murmur heard. Pulmonary:     Effort: Pulmonary effort is normal.     Breath sounds: Normal breath sounds.  Abdominal:     General: Bowel sounds are normal.  Musculoskeletal:     Right lower leg: No edema.     Left lower leg: No edema.  Skin:    General: Skin is warm and dry.  Neurological:     Mental Status: She is alert.    Medications and allergies   Allergies  Allergen Reactions   Sulfa Antibiotics Swelling   Sulfamethoxazole-Trimethoprim Swelling     Medication list after today's encounter   Current Outpatient Medications:    atorvastatin (LIPITOR) 40 MG tablet, Take 1 tablet (40 mg total) by mouth daily., Disp: 90 tablet, Rfl: 3   cyclobenzaprine (FLEXERIL) 10 MG tablet, Take 0.5-1 tablets (5-10 mg total) by mouth 3 (three) times daily as needed for muscle spasms., Disp:  30 tablet, Rfl: 2   hydrochlorothiazide (HYDRODIURIL) 12.5 MG tablet, Take 1 tablet (12.5 mg total) by mouth daily., Disp: 90 tablet, Rfl: 3   ibuprofen (ADVIL) 800 MG tablet, Take 1 tablet (800 mg total) by mouth every 8 (eight) hours as needed., Disp: 30 tablet, Rfl: 2   losartan (COZAAR) 25 MG tablet, Take 1 tablet (25 mg total) by mouth daily., Disp: 90 tablet, Rfl: 1   metoprolol succinate (TOPROL XL) 25 MG 24 hr tablet, Take 1 tablet (25 mg total) by mouth daily., Disp: 90 tablet, Rfl: 3   pantoprazole (PROTONIX) 40 MG tablet, Take 1 tablet (40 mg total) by mouth 2 (two) times daily before a meal., Disp: 90 tablet, Rfl: 3  Laboratory examination:   Lab Results  Component Value Date   NA 137 06/14/2022   K 3.5 06/14/2022   CO2 25 06/14/2022   GLUCOSE 105 (H) 06/14/2022   BUN 10 06/14/2022   CREATININE 0.67 06/14/2022   CALCIUM 9.5 06/14/2022    GFRNONAA >60 06/14/2022       Latest Ref Rng & Units 06/14/2022    6:47 PM 06/12/2022    1:31 PM 01/29/2021   11:21 AM  CMP  Glucose 70 - 99 mg/dL 244  628  92   BUN 6 - 20 mg/dL 10  10  12    Creatinine 0.44 - 1.00 mg/dL  6.38  1.77   Sodium 135 - 145 mmol/L 137  138  136   Potassium 3.5 - 5.1 mmol/L 3.5  3.7  3.9   Chloride 98 - 111 mmol/L 101  104  100   CO2 22 - 32 mmol/L 25  24  24    Calcium 8.9 - 10.3 mg/dL 9.5  9.7  9.8   Total Protein 6.5 - 8.1 g/dL 7.5  7.5  7.7   Total Bilirubin 0.3 - 1.2 mg/dL 0.7  0.2  0.8   Alkaline Phos 38 - 126 U/L 80  85  86   AST 15 - 41 U/L 29  35  12   ALT 0 - 44 U/L 18  17  7        Latest Ref Rng & Units 06/14/2022    6:47 PM 06/12/2022    1:31 PM 01/29/2021   11:21 AM  CBC  WBC 4.0 - 10.5 K/uL 11.9  11.0  11.5   Hemoglobin 12.0 - 15.0 g/dL 06/16/2022  06/14/2022  03/31/2021   Hematocrit 36.0 - 46.0 % 42.8  42.5  41.4   Platelets 150 - 400 K/uL 344  338  331     Lipid Panel Recent Labs    07/08/22 1148  CHOL 311*  TRIG 57.0  LDLCALC 195*  VLDL 11.4  HDL 104.90  CHOLHDL 3    HEMOGLOBIN A1C Lab Results  Component Value Date   HGBA1C 5.3 07/08/2022   TSH No results for input(s): "TSH" in the last 8760 hours.  External labs:     Radiology:    Cardiac Studies:   Echocardiogram 07/19/2022:  Normal LV systolic function with visual EF 60-65%. Left ventricle cavity  is normal in size. Normal left ventricular wall thickness. Normal global  wall motion. Normal diastolic filling pattern, normal LAP. Calculated EF  78%.  Structurally normal mitral valve.  Mild (Grade I) mitral regurgitation.  Structurally normal tricuspid valve with trace regurgitation. No evidence  of pulmonary hypertension.    Exercise nuclear stress test 07/25/2022: Myocardial perfusion is normal. Overall LV systolic function is normal without  regional wall motion abnormalities. Stress LV EF: 70%.  Normal ECG stress. The patient exercised for 8 minutes and 56  seconds of a Bruce protocol, achieving approximately 10.16 METs & 87% MPHR. The blood pressure response was normal. No previous exam available for comparison. Low risk.     EKG:     06/15/22: normal sinus rhythm  Assessment     ICD-10-CM   1. Essential hypertension  I10     2. Mixed hyperlipidemia  E78.2     3. Atypical chest pain  R07.9        No orders of the defined types were placed in this encounter.   Meds ordered this encounter  Medications   metoprolol succinate (TOPROL XL) 25 MG 24 hr tablet    Sig: Take 1 tablet (25 mg total) by mouth daily.    Dispense:  90 tablet    Refill:  3    Medications Discontinued During This Encounter  Medication Reason   Bismuth/Metronidaz/Tetracyclin [facility]) [phone] MG CAPS Completed Course     Recommendations:   Caden Fatica is a 61 y.o.  F with HTN and HLD  Essential hypertension Toprol-XL 25mg  sent to pharmacy.  Continue current cardiac medications. Encourage low-sodium diet, less than 2000 mg daily.   Mixed hyperlipidemia Continue Lipitor 40mg   PCP following labs/lipids Last LDL 195 on 07/18/2022   Atypical chest pain Echo and stress test negative for ischemia Symptoms consistent with fibromyalgia Recommend patient follow-up with PCP regarding this   Follow-up in 6 months or sooner if needed.     , DO, [facility]  09/14/2022, 9:35 AM Office: [phone] Pager: [phone]

## 2022-10-08 ENCOUNTER — Encounter: Payer: Self-pay | Admitting: Certified Registered Nurse Anesthetist

## 2022-10-11 ENCOUNTER — Encounter: Payer: Self-pay | Admitting: Gastroenterology

## 2022-10-11 ENCOUNTER — Ambulatory Visit (AMBULATORY_SURGERY_CENTER): Payer: Commercial Managed Care - HMO | Admitting: Gastroenterology

## 2022-10-11 VITALS — BP 156/77 | HR 67 | Temp 98.2°F | Resp 14 | Ht 64.0 in | Wt 139.0 lb

## 2022-10-11 DIAGNOSIS — K2951 Unspecified chronic gastritis with bleeding: Secondary | ICD-10-CM

## 2022-10-11 DIAGNOSIS — K2970 Gastritis, unspecified, without bleeding: Secondary | ICD-10-CM | POA: Diagnosis present

## 2022-10-11 HISTORY — PX: UPPER GASTROINTESTINAL ENDOSCOPY: SHX188

## 2022-10-11 MED ORDER — SODIUM CHLORIDE 0.9 % IV SOLN: 500.0000 mL | INTRAVENOUS | Status: DC

## 2022-10-11 MED ORDER — PANTOPRAZOLE SODIUM 40 MG PO TBEC: 40.0000 mg | DELAYED_RELEASE_TABLET | Freq: Two times a day (BID) | ORAL | 3 refills | Status: DC

## 2022-10-11 NOTE — Patient Instructions (Addendum)
Resume previous diet.  Continue present medications including pantoprazole 40 mg twice daily.  Await pathology results. No aspirin, ibuprofen, naproxen, or other non-steroidal anti-inflammatory drugs. Repeat upper endoscopy in 8 weeks to check healing  YOU HAD AN ENDOSCOPIC PROCEDURE TODAY AT [place]:   Refer to the procedure report that was given to you for any specific questions about what was found during the examination.  If the procedure report does not answer your questions, please call your gastroenterologist to clarify.  If you requested that your care partner not be given the details of your procedure findings, then the procedure report has been included in a sealed envelope for you to review at your convenience later.  YOU SHOULD EXPECT: Some feelings of bloating in the abdomen. Passage of more gas than usual.  Walking can help get rid of the air that was put into your GI tract during the procedure and reduce the bloating. If you had a lower endoscopy (such as a colonoscopy or flexible sigmoidoscopy) you may notice spotting of blood in your stool or on the toilet paper. If you underwent a bowel prep for your procedure, you may not have a normal bowel movement for a few days.  Please Note:  You might notice some irritation and congestion in your nose or some drainage.  This is from the oxygen used during your procedure.  There is no need for concern and it should clear up in a day or so.  SYMPTOMS TO REPORT IMMEDIATELY:  Following upper endoscopy (EGD)  Vomiting of blood or coffee ground material  New chest pain or pain under the shoulder blades  Painful or persistently difficult swallowing  New shortness of breath  Fever of 100F or higher  Black, tarry-looking stools  For urgent or emergent issues, a gastroenterologist can be reached at any hour by calling [phone]. Do not use MyChart messaging for urgent concerns.    DIET:  We do recommend a small  meal at first, but then you may proceed to your regular diet.  Drink plenty of fluids but you should avoid alcoholic beverages for 24 hours.  ACTIVITY:  You should plan to take it easy for the rest of today and you should NOT DRIVE or use heavy machinery until tomorrow (because of the sedation medicines used during the test).    FOLLOW UP: Our staff will call the number listed on your records the next business day following your procedure.  We will call around 7:15- 8:00 am to check on you and address any questions or concerns that you may have regarding the information given to you following your procedure. If we do not reach you, we will leave a message.     If any biopsies were taken you will be contacted by phone or by letter within the next 1-3 weeks.  Please call us at [phone] if you have not heard about the biopsies in 3 weeks.    SIGNATURES/CONFIDENTIALITY: You and/or your care partner have signed paperwork which will be entered into your electronic medical record.  These signatures attest to the fact that that the information above on your After Visit Summary has been reviewed and is understood.  Full responsibility of the confidentiality of this discharge information lies with you and/or your care-partner.

## 2022-10-11 NOTE — Op Note (Signed)
[place] Patient Name: Tasha Fox Procedure Date: 10/11/2022 7:35 AM MRN: [id number] Endoscopist: [name] MD, MD, [id number] Age: 62 Referring MD:  Date of Birth: [birth year] Gender: Female Account #: [id number] Procedure:                Upper GI endoscopy Indications:              Follow-up of gastric ulcer seen on EGD 07/2022, H                            pylori found on gastric biopsies at that time has                            been treated Medicines:                Monitored Anesthesia Care Procedure:                Pre-Anesthesia Assessment:                           - Prior to the procedure, a History and Physical                            was performed, and patient medications and                            allergies were reviewed. The patient's tolerance of                            previous anesthesia was also reviewed. The risks                            and benefits of the procedure and the sedation                            options and risks were discussed with the patient.                            All questions were answered, and informed consent                            was obtained. Prior Anticoagulants: The patient has                            taken no anticoagulant or antiplatelet agents. ASA                            Grade Assessment: II - A patient with mild systemic                            disease. After reviewing the risks and benefits,                            the patient was deemed in satisfactory condition to  undergo the procedure.                           After obtaining informed consent, the endoscope was                            passed under direct vision. Throughout the                            procedure, the patient's blood pressure, pulse, and                            oxygen saturations were monitored continuously. The                            GIF [id number] #[id number] was introduced  through the                            mouth, and advanced to the second part of duodenum.                            The upper GI endoscopy was accomplished without                            difficulty. The patient tolerated the procedure                            well. Scope In: Scope Out: Findings:                 The esophagus was normal.                           The prepyloric mucosa is congested, erythematous,                            friable (with contact bleeding) and granular. No                            discrete ulcer seen but the mucosa in this area has                            a subtle heaped appearance. Suspected location of                            prior ulceration, but, the endoscipic appearance                            has not changed much since 11/23. Biopsies were                            taken with a cold forceps for histology. Estimated                            blood loss was minimal.  The entire examined stomach was otherwise normal.                            Biopsies were taken from the antrum, body, and                            fundus with a cold forceps for histology. Estimated                            blood loss was minimal.                           The examined duodenum was normal. Complications:            No immediate complications. Estimated Blood Loss:     Estimated blood loss was minimal. Impression:               - Normal esophagus.                           - Congested, erythematous, friable (with contact                            bleeding) and granular mucosa in the prepyloric                            region of the stomach. Biopsied.                           - Otherwise normal stomach. Biopsied.                           - Normal examined duodenum. Recommendation:           - Patient has a contact number available for                            emergencies. The signs and symptoms of potential                             delayed complications were discussed with the                            patient. Return to normal activities tomorrow.                            Written discharge instructions were provided to the                            patient.                           - Resume previous diet.                           - Continue present medications including  pantoprazole 40 mg twice daily.                           - Await pathology results.                           - No aspirin, ibuprofen, naproxen, or other                            non-steroidal anti-inflammatory drugs.                           - Repeat upper endoscopy in 8 weeks to check                            healing. [name] MD, MD 10/11/2022 8:35:54 AM This report has been signed electronically.

## 2022-10-11 NOTE — Progress Notes (Signed)
Called to room to assist during endoscopic procedure.  Patient ID and intended procedure confirmed with present staff. Received instructions for my participation in the procedure from the performing physician.

## 2022-10-11 NOTE — Progress Notes (Signed)
   Referring Provider: Farrel Conners, MD Primary Care Physician:  Farrel Conners, MD   Indication for EGD:  Following on gastric ulcer healing   IMPRESSION:  Gastric EGD Appropriate candidate for monitored anesthesia care  PLAN: EGD in the [place] today   HPI: Tasha Fox is a 62 y.o. female presents for surveillance EGD. Abnormal antrum on EGD 08/05/22. Thought to be healing ulcer. Biopsies showed H Pylori.    Past Medical History:  Diagnosis Date   Chronic lower back pain    GERD (gastroesophageal reflux disease)    History of stomach ulcers     Past Surgical History:  Procedure Laterality Date   KNEE ARTHROSCOPY Right    UPPER GASTROINTESTINAL ENDOSCOPY  10/11/2022    Current Outpatient Medications  Medication Sig Dispense Refill   atorvastatin (LIPITOR) 40 MG tablet Take 1 tablet (40 mg total) by mouth daily. 90 tablet 3   hydrochlorothiazide (HYDRODIURIL) 12.5 MG tablet Take 1 tablet (12.5 mg total) by mouth daily. 90 tablet 3   ibuprofen (ADVIL) 800 MG tablet Take 1 tablet (800 mg total) by mouth every 8 (eight) hours as needed. 30 tablet 2   losartan (COZAAR) 25 MG tablet Take 1 tablet (25 mg total) by mouth daily. 90 tablet 1   metoprolol succinate (TOPROL XL) 25 MG 24 hr tablet Take 1 tablet (25 mg total) by mouth daily. 90 tablet 3   cyclobenzaprine (FLEXERIL) 10 MG tablet Take 0.5-1 tablets (5-10 mg total) by mouth 3 (three) times daily as needed for muscle spasms. 30 tablet 2   pantoprazole (PROTONIX) 40 MG tablet Take 1 tablet (40 mg total) by mouth 2 (two) times daily before a meal. 90 tablet 3   Current Facility-Administered Medications  Medication Dose Route Frequency Provider Last Rate Last Admin   0.9 %  sodium chloride infusion  500 mL Intravenous Continuous [name], MD        Allergies as of 10/11/2022 - Review Complete 10/11/2022  Allergen Reaction Noted   Sulfa antibiotics Swelling 01/13/2014    Sulfamethoxazole-trimethoprim Swelling 06/02/2011    Family History  Problem Relation Age of Onset   Diabetes Mother    Hypertension Brother    Stomach cancer Neg Hx    Esophageal cancer Neg Hx    Colon cancer Neg Hx      Physical Exam: General:   Alert,  well-nourished, pleasant and cooperative in NAD Head:  Normocephalic and atraumatic. Eyes:  Sclera clear, no icterus.   Conjunctiva pink. Mouth:  No deformity or lesions.   Neck:  Supple; no masses or thyromegaly. Lungs:  Clear throughout to auscultation.   No wheezes. Heart:  Regular rate and rhythm; no murmurs. Abdomen:  Soft, non-tender, nondistended, normal bowel sounds, no rebound or guarding.  Msk:  Symmetrical. No boney deformities LAD: No inguinal or umbilical LAD Extremities:  No clubbing or edema. Neurologic:  Alert and  oriented x4;  grossly nonfocal Skin:  No obvious rash or bruise. Psych:  Alert and cooperative. Normal mood and affect.     Studies/Results: No results found.    [name], MD, MPH 10/11/2022, 8:14 AM

## 2022-10-11 NOTE — Progress Notes (Signed)
A and O x3. Report to RN. Tolerated MAC anesthesia well.Teeth unchanged after procedure. 

## 2022-10-12 ENCOUNTER — Telehealth: Payer: Self-pay

## 2022-10-12 ENCOUNTER — Telehealth: Payer: Self-pay | Admitting: Gastroenterology

## 2022-10-12 MED ORDER — OMEPRAZOLE 40 MG PO CPDR: 40.0000 mg | DELAYED_RELEASE_CAPSULE | Freq: Two times a day (BID) | ORAL | 3 refills | Status: DC

## 2022-10-12 NOTE — Telephone Encounter (Signed)
Switched patient to Omeprazole 40 mg twice daily. Patient informed. Informed patient is $20 with [facility] if it is not covered with insurance. Patient agreed with change.

## 2022-10-12 NOTE — Telephone Encounter (Signed)
Dr Beavers please advise  ?

## 2022-10-12 NOTE — Telephone Encounter (Signed)
Follow up call placed, VM obtained and message left. 

## 2022-10-12 NOTE — Telephone Encounter (Signed)
Patient states she had a procedure yesterday and Pantoprazole was sent in to her pharmacy. Patient is requesting a more affordable option since that medication is too expensive for her.

## 2022-11-18 ENCOUNTER — Encounter: Payer: Self-pay | Admitting: Gastroenterology

## 2022-12-02 ENCOUNTER — Ambulatory Visit (AMBULATORY_SURGERY_CENTER): Payer: Commercial Managed Care - HMO | Admitting: Gastroenterology

## 2022-12-02 ENCOUNTER — Telehealth: Payer: Self-pay | Admitting: *Deleted

## 2022-12-02 ENCOUNTER — Encounter: Payer: Self-pay | Admitting: Gastroenterology

## 2022-12-02 VITALS — BP 135/76 | HR 67 | Temp 97.0°F | Resp 18 | Ht 64.0 in | Wt 147.0 lb

## 2022-12-02 DIAGNOSIS — K2951 Unspecified chronic gastritis with bleeding: Secondary | ICD-10-CM | POA: Diagnosis not present

## 2022-12-02 DIAGNOSIS — R933 Abnormal findings on diagnostic imaging of other parts of digestive tract: Secondary | ICD-10-CM

## 2022-12-02 DIAGNOSIS — K2970 Gastritis, unspecified, without bleeding: Secondary | ICD-10-CM

## 2022-12-02 MED ORDER — SODIUM CHLORIDE 0.9 % IV SOLN: 500.0000 mL | INTRAVENOUS | Status: DC

## 2022-12-02 NOTE — Progress Notes (Signed)
   Referring Provider: Farrel Conners, MD Primary Care Physician:  Farrel Conners, MD   Indication for EGD: Evaluate for healing of the gastric ulcer   IMPRESSION:  Persistent gastric ulcer Appropriate candidate for monitored anesthesia care  PLAN: EGD  in the [place] today   HPI: Tasha Fox is a 62 y.o. female presents for evaluation of a persistent gastric ulcer.  He was initially seen on endoscopy 08/05/2022.  Despite treatment for H. pylori at follow-up endoscopy 10/11/2022 there was still persistent ulcer.  Biopsies in January showed persistent gastritis.  No ongoing H. pylori.  She returns today for evaluation to document healing.  No prior colonoscopy or colon cancer screening.  No known family history of colon cancer or polyps. No family history of uterine/endometrial cancer, pancreatic cancer or gastric/stomach cancer.   Past Medical History:  Diagnosis Date   Chronic lower back pain    GERD (gastroesophageal reflux disease)    History of stomach ulcers     Past Surgical History:  Procedure Laterality Date   KNEE ARTHROSCOPY Right    UPPER GASTROINTESTINAL ENDOSCOPY  10/11/2022    Current Outpatient Medications  Medication Sig Dispense Refill   atorvastatin (LIPITOR) 40 MG tablet Take 1 tablet (40 mg total) by mouth daily. 90 tablet 3   cyclobenzaprine (FLEXERIL) 10 MG tablet Take 0.5-1 tablets (5-10 mg total) by mouth 3 (three) times daily as needed for muscle spasms. 30 tablet 2   hydrochlorothiazide (HYDRODIURIL) 12.5 MG tablet Take 1 tablet (12.5 mg total) by mouth daily. 90 tablet 3   ibuprofen (ADVIL) 800 MG tablet Take 1 tablet (800 mg total) by mouth every 8 (eight) hours as needed. 30 tablet 2   losartan (COZAAR) 25 MG tablet Take 1 tablet (25 mg total) by mouth daily. 90 tablet 1   metoprolol succinate (TOPROL XL) 25 MG 24 hr tablet Take 1 tablet (25 mg total) by mouth daily. 90 tablet 3   omeprazole (PRILOSEC) 40 MG capsule Take 1 capsule  (40 mg total) by mouth 2 (two) times daily. 60 capsule 3   No current facility-administered medications for this visit.    Allergies as of 12/02/2022 - Review Complete 12/02/2022  Allergen Reaction Noted   Sulfa antibiotics Swelling 01/13/2014   Sulfamethoxazole-trimethoprim Swelling 06/02/2011    Family History  Problem Relation Age of Onset   Diabetes Mother    Hypertension Brother    Stomach cancer Neg Hx    Esophageal cancer Neg Hx    Colon cancer Neg Hx      Physical Exam: General:   Alert,  well-nourished, pleasant and cooperative in NAD Head:  Normocephalic and atraumatic. Eyes:  Sclera clear, no icterus.   Conjunctiva pink. Mouth:  No deformity or lesions.   Neck:  Supple; no masses or thyromegaly. Lungs:  Clear throughout to auscultation.   No wheezes. Heart:  Regular rate and rhythm; no murmurs. Abdomen:  Soft, non-tender, nondistended, normal bowel sounds, no rebound or guarding.  Msk:  Symmetrical. No boney deformities LAD: No inguinal or umbilical LAD Extremities:  No clubbing or edema. Neurologic:  Alert and  oriented x4;  grossly nonfocal Skin:  No obvious rash or bruise. Psych:  Alert and cooperative. Normal mood and affect.     Studies/Results: No results found.    Porfirio Bollier L. Tarri Glenn, MD, MPH 12/02/2022, 7:26 AM

## 2022-12-02 NOTE — Progress Notes (Signed)
Pt's states no medical or surgical changes since previsit or office visit. 

## 2022-12-02 NOTE — Telephone Encounter (Signed)
-----   Message from [name], MD sent at 12/02/2022  8:32 AM EST ----- Please schedule office visit in 3-6 weeks with any available APP.  Thanks.  KLB

## 2022-12-02 NOTE — Patient Instructions (Addendum)
Continue present medications Resume previous diet Information given on gastritis. Await pathology results, 1-2 weeks, if not received, call the office  YOU HAD AN ENDOSCOPIC PROCEDURE TODAY AT [employer]:   Refer to the procedure report that was given to you for any specific questions about what was found during the examination.  If the procedure report does not answer your questions, please call your gastroenterologist to clarify.  If you requested that your care partner not be given the details of your procedure findings, then the procedure report has been included in a sealed envelope for you to review at your convenience later.  YOU SHOULD EXPECT: Some feelings of bloating in the abdomen. Passage of more gas than usual.  Walking can help get rid of the air that was put into your GI tract during the procedure and reduce the bloating. If you had a lower endoscopy (such as a colonoscopy or flexible sigmoidoscopy) you may notice spotting of blood in your stool or on the toilet paper. If you underwent a bowel prep for your procedure, you may not have a normal bowel movement for a few days.  Please Note:  You might notice some irritation and congestion in your nose or some drainage.  This is from the oxygen used during your procedure.  There is no need for concern and it should clear up in a day or so.  SYMPTOMS TO REPORT IMMEDIATELY:  Following upper endoscopy (EGD)  Vomiting of blood or coffee ground material  New chest pain or pain under the shoulder blades  Painful or persistently difficult swallowing  New shortness of breath  Fever of 100F or higher  Black, tarry-looking stools  For urgent or emergent issues, a gastroenterologist can be reached at any hour by calling [phone]. Do not use MyChart messaging for urgent concerns.    DIET:  We do recommend a small meal at first, but then you may proceed to your regular diet.  Drink plenty of fluids but you should avoid  alcoholic beverages for 24 hours.  ACTIVITY:  You should plan to take it easy for the rest of today and you should NOT DRIVE or use heavy machinery until tomorrow (because of the sedation medicines used during the test).    FOLLOW UP: Our staff will call the number listed on your records the next business day following your procedure.  We will call around 7:15- 8:00 am to check on you and address any questions or concerns that you may have regarding the information given to you following your procedure. If we do not reach you, we will leave a message.     If any biopsies were taken you will be contacted by phone or by letter within the next 1-3 weeks.  Please call us at [phone] if you have not heard about the biopsies in 3 weeks.    SIGNATURES/CONFIDENTIALITY: You and/or your care partner have signed paperwork which will be entered into your electronic medical record.  These signatures attest to the fact that that the information above on your After Visit Summary has been reviewed and is understood.  Full responsibility of the confidentiality of this discharge information lies with you and/or your care-partner.

## 2022-12-02 NOTE — Progress Notes (Signed)
Called to room to assist during endoscopic procedure.  Patient ID and intended procedure confirmed with present staff. Received instructions for my participation in the procedure from the performing physician.  

## 2022-12-02 NOTE — Progress Notes (Signed)
Vss nad trans to pacu 

## 2022-12-02 NOTE — Op Note (Signed)
[facility] Patient Name: Tasha Fox Procedure Date: 12/02/2022 8:14 AM MRN: [id number] Endoscopist: [name] MD, MD, [id number] Age: 62 Referring MD:  Date of Birth: 09/24/1961 Gender: Female Account #: [id number] Procedure:                Upper GI endoscopy Indications:              Follow-up of chronic gastric ulcer Medicines:                Monitored Anesthesia Care Procedure:                Pre-Anesthesia Assessment:                           - Prior to the procedure, a History and Physical                            was performed, and patient medications and                            allergies were reviewed. The patient's tolerance of                            previous anesthesia was also reviewed. The risks                            and benefits of the procedure and the sedation                            options and risks were discussed with the patient.                            All questions were answered, and informed consent                            was obtained. Prior Anticoagulants: The patient has                            taken no anticoagulant or antiplatelet agents. ASA                            Grade Assessment: I - A normal, healthy patient.                            After reviewing the risks and benefits, the patient                            was deemed in satisfactory condition to undergo the                            procedure.                           After obtaining informed consent, the endoscope was  passed under direct vision. Throughout the                            procedure, the patient's blood pressure, pulse, and                            oxygen saturations were monitored continuously. The                            GIF [id number] was introduced through the                            mouth, and advanced to the second part of duodenum.                            The upper GI endoscopy was  accomplished without                            difficulty. The patient tolerated the procedure                            well. Scope In: Scope Out: Findings:                 The Z-line was regular and was found 38 cm from the                            incisors. The esophagus otherwise appears normal.                           A deformity was found in the prepyloric region of                            the stomach at the site of prior gastric ulcer.                           Diffuse minimal inflammation characterized by                            erythema, friability and granularity was found in                            the gastric body. Biopsies were taken from the                            antrum, body, and fundus with a cold forceps for                            histology. Estimated blood loss was minimal.                           The examined duodenum was normal.  The cardia and gastric fundus were normal on                            retroflexion. Complications:            No immediate complications. Estimated Blood Loss:     Estimated blood loss was minimal. Impression:               - Z-line regular, 38 cm from the incisors.                           - Deformity in the prepyloric region of the stomach.                           - Gastritis. Biopsied.                           - Normal examined duodenum. Recommendation:           - Patient has a contact number available for                            emergencies. The signs and symptoms of potential                            delayed complications were discussed with the                            patient. Return to normal activities tomorrow.                            Written discharge instructions were provided to the                            patient.                           - Resume previous diet.                           - Continue present medications.                           - Await  pathology results. [name] MD, MD 12/02/2022 8:32:29 AM This report has been signed electronically.

## 2022-12-05 ENCOUNTER — Telehealth: Payer: Self-pay | Admitting: *Deleted

## 2022-12-05 NOTE — Telephone Encounter (Signed)
  Follow up Call-     12/02/2022    7:29 AM 10/11/2022    7:15 AM 08/05/2022    7:03 AM 08/05/2022    6:58 AM  Call back number  Post procedure Call Back phone  # [phone]   Permission to leave phone message Yes Yes  Yes     Patient questions:  Do you have a fever, pain , or abdominal swelling? No. Pain Score  0 *  Have you tolerated food without any problems? Yes.    Have you been able to return to your normal activities? Yes.    Do you have any questions about your discharge instructions: Diet   No. Medications  No. Follow up visit  No.  Do you have questions or concerns about your Care? No.  Actions: * If pain score is 4 or above: No action needed, pain <4.

## 2022-12-07 ENCOUNTER — Ambulatory Visit: Payer: Commercial Managed Care - HMO | Admitting: Family Medicine

## 2022-12-07 ENCOUNTER — Encounter: Payer: Self-pay | Admitting: Family Medicine

## 2022-12-07 VITALS — BP 152/78 | HR 95 | Temp 98.6°F | Ht 64.0 in | Wt 152.9 lb

## 2022-12-07 DIAGNOSIS — M545 Low back pain, unspecified: Secondary | ICD-10-CM | POA: Insufficient documentation

## 2022-12-07 DIAGNOSIS — K219 Gastro-esophageal reflux disease without esophagitis: Secondary | ICD-10-CM | POA: Diagnosis not present

## 2022-12-07 DIAGNOSIS — Z1231 Encounter for screening mammogram for malignant neoplasm of breast: Secondary | ICD-10-CM

## 2022-12-07 DIAGNOSIS — G8929 Other chronic pain: Secondary | ICD-10-CM

## 2022-12-07 DIAGNOSIS — I10 Essential (primary) hypertension: Secondary | ICD-10-CM

## 2022-12-07 LAB — COMPREHENSIVE METABOLIC PANEL
ALT: 23 U/L (ref 0–35)
AST: 22 U/L (ref 0–37)
Albumin: 4.3 g/dL (ref 3.5–5.2)
Alkaline Phosphatase: 95 U/L (ref 39–117)
BUN: 11 mg/dL (ref 6–23)
CO2: 30 mEq/L (ref 19–32)
Calcium: 9.8 mg/dL (ref 8.4–10.5)
Chloride: 101 mEq/L (ref 96–112)
Creatinine, Ser: 0.84 mg/dL (ref 0.40–1.20)
GFR: 76.04 mL/min (ref >60.00)
Glucose, Bld: 117 mg/dL — ABNORMAL HIGH (ref 70–99)
Potassium: 3.7 mEq/L (ref 3.5–5.1)
Sodium: 138 mEq/L (ref 135–145)
Total Bilirubin: 0.4 mg/dL (ref 0.2–1.2)
Total Protein: 7.5 g/dL (ref 6.0–8.3)

## 2022-12-07 LAB — LIPID PANEL
Cholesterol: 244 mg/dL — ABNORMAL HIGH (ref 0–200)
HDL: 85.2 mg/dL (ref >39.00)
LDL Cholesterol: 134 mg/dL — ABNORMAL HIGH (ref 0–99)
NonHDL: 158.69
Total CHOL/HDL Ratio: 3
Triglycerides: 121 mg/dL (ref 0.0–149.0)
VLDL: 24.2 mg/dL (ref 0.0–40.0)

## 2022-12-07 MED ORDER — CYCLOBENZAPRINE HCL 10 MG PO TABS: 5.0000 mg | ORAL_TABLET | Freq: Three times a day (TID) | ORAL | 5 refills | Status: DC | PRN

## 2022-12-07 NOTE — Progress Notes (Signed)
Established Patient Office Visit  Subjective   Patient ID: Tasha Fox, female    DOB: May 17, 1961  Age: 62 y.o. MRN: [id number]  Chief Complaint  Patient presents with   Medical Management of Chronic Issues    HTN-- pt states she is taking her BP at home. States that her readings at home are in the normal range. States she is compliant with her medication every day. Did not bring her BP cuff today. BP is elevated in the office today. Recheck done by me is 150/88. I have advised the patient to bring in her BP cuff in a nursing visit to compare it to our cuffs here.  Pt states she saw a dermatologist years ago and had a scalp condition, states that she would get painful spots on her scalp, states that it feels like her hair was "poking her" and she had a medication that helped her but she is not sure what it was called.   HLD-- pt is taking her atorvastatin 40 mg daily as prescribed. She is tolerating the medication well without side effects. Needs new lipid panel today and CMP for follow up to make sure LFTs are stable and her cholesterol is improving.    Current Outpatient Medications  Medication Instructions   atorvastatin (LIPITOR) 40 mg, Oral, Daily   cyclobenzaprine (FLEXERIL) 5-10 mg, Oral, 3 times daily PRN   hydrochlorothiazide (HYDRODIURIL) 12.5 mg, Oral, Daily   ibuprofen (ADVIL) 800 mg, Oral, Every 8 hours PRN   losartan (COZAAR) 25 mg, Oral, Daily   metoprolol succinate (TOPROL XL) 25 mg, Oral, Daily   omeprazole (PRILOSEC) 40 mg, Oral, 2 times daily    Patient Active Problem List   Diagnosis Date Noted   Chronic low back pain 12/07/2022   Mixed hyperlipidemia 09/14/2022   Carpal tunnel syndrome, bilateral 09/07/2022   GERD (gastroesophageal reflux disease)    Right upper quadrant abdominal pain 06/15/2022   Chest pain of uncertain etiology 123456   Essential hypertension 06/15/2022   Unilateral primary osteoarthritis, right knee 05/07/2020       Review of Systems  All other systems reviewed and are negative.     Objective:     BP (!) 152/78 Comment: repeated by Mykal--jaf  Pulse 95   Temp 98.6 F (37 C) (Oral)   Ht '5\' 4"'$  (1.626 m)   Wt 152 lb 14.4 oz (69.4 kg)   LMP  (LMP Unknown)   SpO2 98%   BMI 26.25 kg/m    Physical Exam Vitals reviewed.  Constitutional:      Appearance: Normal appearance. She is well-groomed and normal weight.  Eyes:     Conjunctiva/sclera: Conjunctivae normal.  Cardiovascular:     Rate and Rhythm: Normal rate and regular rhythm.     Pulses: Normal pulses.     Heart sounds: S1 normal and S2 normal.  Pulmonary:     Effort: Pulmonary effort is normal.     Breath sounds: Normal breath sounds and air entry.  Abdominal:     General: Bowel sounds are normal.  Musculoskeletal:     Right lower leg: No edema.     Left lower leg: No edema.  Neurological:     Mental Status: She is alert and oriented to person, place, and time. Mental status is at baseline.     Gait: Gait is intact.  Psychiatric:        Mood and Affect: Mood and affect normal.        Speech: Speech  normal.        Behavior: Behavior normal.        Judgment: Judgment normal.      No results found for any visits on 12/07/22.    The ASCVD Risk score (Arnett DK, et al., 2019) failed to calculate for the following reasons:   The valid HDL cholesterol range is 20 to 100 mg/dL    Assessment & Plan:   Problem List Items Addressed This Visit       Unprioritized   Essential hypertension - Primary    Current hypertension medications:       Sig   hydrochlorothiazide (HYDRODIURIL) 12.5 MG tablet (Taking) Take 1 tablet (12.5 mg total) by mouth daily.   losartan (COZAAR) 25 MG tablet (Taking) Take 1 tablet (25 mg total) by mouth daily.   metoprolol succinate (TOPROL XL) 25 MG 24 hr tablet (Taking) Take 1 tablet (25 mg total) by mouth daily.     BP remains elevated in office, however pt states she is getting normal  readings at home. I advised that she bring in her BP cuff and we will measure it against our cuffs here to see if it is accurate at home. For now continue the above medications/      Relevant Orders   Lipid Panel   CMP   GERD (gastroesophageal reflux disease)    Reviewed most recent EGD, continue BID PPI      Chronic low back pain (Chronic)    Pt continues to report chronic low back pain, she has seen [facility] in the past. She reports that the muscle relaxers helped a little bit.  I refilled the flexeril 10 mg TID PRN. I advised that if her pain continues then it would be prudent to see the specialist again and I can place referral for her if she decides she wants to go back to them.       Relevant Medications   cyclobenzaprine (FLEXERIL) 10 MG tablet   Other Visit Diagnoses     Breast cancer screening by mammogram       Relevant Orders   MM Digital Screening       Return in about 8 months (around 08/09/2023), or pt needs  nursing visit for BP check also, for annual physical with pap smear.    Farrel Conners, MD

## 2022-12-07 NOTE — Patient Instructions (Signed)
Bring in your blood pressure cuff to be checked with our blood pressure cuffs

## 2022-12-07 NOTE — Assessment & Plan Note (Signed)
Current hypertension medications:       Sig   hydrochlorothiazide (HYDRODIURIL) 12.5 MG tablet (Taking) Take 1 tablet (12.5 mg total) by mouth daily.   losartan (COZAAR) 25 MG tablet (Taking) Take 1 tablet (25 mg total) by mouth daily.   metoprolol succinate (TOPROL XL) 25 MG 24 hr tablet (Taking) Take 1 tablet (25 mg total) by mouth daily.      BP remains elevated in office, however pt states she is getting normal readings at home. I advised that she bring in her BP cuff and we will measure it against our cuffs here to see if it is accurate at home. For now continue the above medications/

## 2022-12-07 NOTE — Assessment & Plan Note (Addendum)
Pt continues to report chronic low back pain, she has seen [facility] in the past. She reports that the muscle relaxers helped a little bit.  I refilled the flexeril 10 mg TID PRN. I advised that if her pain continues then it would be prudent to see the specialist again and I can place referral for her if she decides she wants to go back to them.

## 2022-12-07 NOTE — Assessment & Plan Note (Signed)
Reviewed most recent EGD, continue BID PPI

## 2022-12-08 ENCOUNTER — Ambulatory Visit
Admission: RE | Admit: 2022-12-08 | Discharge: 2022-12-08 | Disposition: A | Discharge status: Home / Self Care | Payer: 59 | Source: Ambulatory Visit | Attending: Family Medicine | Admitting: Family Medicine

## 2022-12-08 DIAGNOSIS — Z1231 Encounter for screening mammogram for malignant neoplasm of breast: Secondary | ICD-10-CM

## 2022-12-26 ENCOUNTER — Encounter: Payer: Self-pay | Admitting: Gastroenterology

## 2022-12-27 ENCOUNTER — Other Ambulatory Visit (HOSPITAL_COMMUNITY): Payer: Self-pay

## 2023-01-02 ENCOUNTER — Ambulatory Visit (INDEPENDENT_AMBULATORY_CARE_PROVIDER_SITE_OTHER): Payer: Commercial Managed Care - HMO | Admitting: Orthopaedic Surgery

## 2023-01-02 ENCOUNTER — Telehealth: Payer: Self-pay

## 2023-01-02 DIAGNOSIS — M25562 Pain in left knee: Secondary | ICD-10-CM | POA: Diagnosis not present

## 2023-01-02 DIAGNOSIS — M25561 Pain in right knee: Secondary | ICD-10-CM | POA: Diagnosis not present

## 2023-01-02 DIAGNOSIS — G8929 Other chronic pain: Secondary | ICD-10-CM | POA: Diagnosis not present

## 2023-01-02 MED ORDER — METHYLPREDNISOLONE ACETATE 40 MG/ML IJ SUSP
40.0000 mg | INTRAMUSCULAR | Status: AC | PRN
  Administered 2023-01-02: 40 mg via INTRA_ARTICULAR

## 2023-01-02 MED ORDER — LIDOCAINE HCL 1 % IJ SOLN
3.0000 mL | INTRAMUSCULAR | Status: AC | PRN
  Administered 2023-01-02: 3 mL

## 2023-01-02 MED ORDER — MELOXICAM 15 MG PO TABS: 15.0000 mg | ORAL_TABLET | Freq: Every day | ORAL | 1 refills | Status: DC

## 2023-01-02 NOTE — Progress Notes (Signed)
The patient is someone we last saw in June of last year.  She is only 62 years old and thin but is been dealing with chronic right knee pain and now she is having some left knee pain.  At 1 point we had ordered hyaluronic acid for her right knee and I am sure what happened from that standpoint.  A MRI previously of the right knee showed some mild arthritis but no meniscal tear.  She says she has a hard time getting down her knees and her right knee does swell and she points to the medial aspect of both knees as a source of her pain.  Examination of both knees today show medial joint line tenderness but good range of motion even though she feels that she has significant pain in her right knee past 90 degrees of flexion.  Neither knee has an effusion on my exam but both knees are tender along the medial joint line especially over the medial collateral ligament on both knees.  Both knees are ligamentously stable.  At this point I do recommend a steroid injection of both knees and I do feel that she is a candidate for trying hyaluronic acid for these knees.  I would like to also send her to physical therapy for any modalities that can help strengthen her knees and decrease her knee pain.  I will also send in meloxicam as an anti-inflammatory.  Will see her back in 6 weeks.  She agrees with this treatment plan.  This patient is diagnosed with osteoarthritis of the knee(s).    Radiographs show evidence of joint space narrowing, osteophytes, subchondral sclerosis and/or subchondral cysts.  This patient has knee pain which interferes with functional and activities of daily living.    This patient has experienced inadequate response, adverse effects and/or intolerance with conservative treatments such as acetaminophen, NSAIDS, topical creams, physical therapy or regular exercise, knee bracing and/or weight loss.   This patient has experienced inadequate response or has a contraindication to intra articular  steroid injections for at least 3 months.   This patient is not scheduled to have a total knee replacement within 6 months of starting treatment with viscosupplementation.     Procedure Note  Patient: Tasha Fox             Date of Birth: 09-09-1961           MRN: [id number]             Visit Date: 01/02/2023  Procedures: Visit Diagnoses:  1. Chronic pain of left knee   2. Chronic pain of right knee     Large Joint Inj: R knee on 01/02/2023 9:10 AM Indications: diagnostic evaluation and pain Details: 22 G 1.5 in needle, superolateral approach  Arthrogram: No  Medications: 3 mL lidocaine 1 %; 40 mg methylPREDNISolone acetate 40 MG/ML Outcome: tolerated well, no immediate complications Procedure, treatment alternatives, risks and benefits explained, specific risks discussed. Consent was given by the patient. Immediately prior to procedure a time out was called to verify the correct patient, procedure, equipment, support staff and site/side marked as required. Patient was prepped and draped in the usual sterile fashion.    Large Joint Inj: L knee on 01/02/2023 9:10 AM Indications: diagnostic evaluation and pain Details: 22 G 1.5 in needle, superolateral approach  Arthrogram: No  Medications: 3 mL lidocaine 1 %; 40 mg methylPREDNISolone acetate 40 MG/ML Outcome: tolerated well, no immediate complications Procedure, treatment alternatives, risks and benefits explained, specific  risks discussed. Consent was given by the patient. Immediately prior to procedure a time out was called to verify the correct patient, procedure, equipment, support staff and site/side marked as required. Patient was prepped and draped in the usual sterile fashion.

## 2023-01-02 NOTE — Telephone Encounter (Signed)
Can we re-order Durolane for her please

## 2023-01-03 ENCOUNTER — Telehealth: Payer: Self-pay

## 2023-01-03 ENCOUNTER — Other Ambulatory Visit: Payer: Self-pay

## 2023-01-03 ENCOUNTER — Other Ambulatory Visit: Payer: Self-pay | Admitting: Family Medicine

## 2023-01-03 DIAGNOSIS — G8929 Other chronic pain: Secondary | ICD-10-CM

## 2023-01-03 NOTE — Telephone Encounter (Signed)
Duplicate message. 

## 2023-01-03 NOTE — Telephone Encounter (Signed)
VOB submitted for Durolane, bilateral knee  

## 2023-01-03 NOTE — Telephone Encounter (Signed)
Bilateral gel knee injections  

## 2023-01-03 NOTE — Telephone Encounter (Signed)
Noted. ?Will submit for bilateral knee. ? ?

## 2023-01-13 ENCOUNTER — Encounter: Payer: Self-pay | Admitting: Nurse Practitioner

## 2023-01-13 ENCOUNTER — Ambulatory Visit (INDEPENDENT_AMBULATORY_CARE_PROVIDER_SITE_OTHER): Payer: Commercial Managed Care - HMO | Admitting: Nurse Practitioner

## 2023-01-13 VITALS — BP 136/80 | HR 89 | Ht 64.0 in | Wt 155.0 lb

## 2023-01-13 DIAGNOSIS — R1012 Left upper quadrant pain: Secondary | ICD-10-CM
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
## 2023-01-13 NOTE — Patient Instructions (Addendum)
Your left abdominal pain is positional, please follow up with your primary care provide for further evaluation and treatment. Consider seeing a  back specialist.  You have gained 30 to 40 pounds over the past year which may be contributing to your symptoms. I recommend for you to reduce the carbohydrates in your diet (ie: reduce bread/pasta/rice/potato/sweets), exercise as tolerated and lose 15 to 20 bs over the next 4 months.   Your left upper abdominal pain is unrelated to eating and unlikely related to your past H. Pylori infection. Your recent upper endoscopy 12/02/2022 did not show any stomach ulcers or evidence of H. Pylori. Continue taking Omeprazole  twice daily.   Contact our office if your symptoms persist and I will order updated labs and an abdominal/pelvic CT scan.  Thank you for trusting me with your gastrointestinal care!   Alcide Evener, CRNP

## 2023-02-02 ENCOUNTER — Other Ambulatory Visit: Payer: Self-pay

## 2023-02-02 ENCOUNTER — Ambulatory Visit (INDEPENDENT_AMBULATORY_CARE_PROVIDER_SITE_OTHER): Payer: Commercial Managed Care - HMO | Admitting: Physical Therapy

## 2023-02-02 ENCOUNTER — Encounter: Payer: Self-pay | Admitting: Physical Therapy

## 2023-02-02 DIAGNOSIS — G8929 Other chronic pain: Secondary | ICD-10-CM | POA: Diagnosis not present

## 2023-02-02 DIAGNOSIS — M25562 Pain in left knee: Secondary | ICD-10-CM | POA: Diagnosis not present

## 2023-02-02 DIAGNOSIS — M25561 Pain in right knee: Secondary | ICD-10-CM

## 2023-02-02 NOTE — Therapy (Addendum)
OUTPATIENT PHYSICAL THERAPY EVALUATION DISCHARGE SUMMARY   Patient Name: Tasha Fox MRN: [id number] DOB:Jul 28, 1961, 62 y.o., female Today's Date: 02/02/2023  END OF SESSION:  PT End of Session - 02/02/23 0947     Visit Number 1    Number of Visits 4    Date for PT Re-Evaluation 03/02/23    Authorization Type [employer] and [employer]    PT Start Time 0803    PT Stop Time 0840    PT Time Calculation (min) 37 min    Activity Tolerance Patient tolerated treatment well    Behavior During Therapy [facility] for tasks assessed/performed             Past Medical History:  Diagnosis Date   Chronic lower back pain    GERD (gastroesophageal reflux disease)    History of stomach ulcers    Past Surgical History:  Procedure Laterality Date   KNEE ARTHROSCOPY Right    UPPER GASTROINTESTINAL ENDOSCOPY  10/11/2022   Patient Active Problem List   Diagnosis Date Noted   Chronic low back pain 12/07/2022   Mixed hyperlipidemia 09/14/2022   Carpal tunnel syndrome, bilateral 09/07/2022   GERD (gastroesophageal reflux disease)    Right upper quadrant abdominal pain 06/15/2022   Chest pain of uncertain etiology 06/15/2022   Essential hypertension 06/15/2022   Unilateral primary osteoarthritis, right knee 05/07/2020    PCP: Nira Conn, MD  REFERRING PROVIDER: Kathryne Hitch, MD  REFERRING DIAG: [phone] (ICD-10-CM) - Chronic pain of left knee M25.561,G89.29 (ICD-10-CM) - Chronic pain of right knee  Rationale for Evaluation and Treatment: Rehabilitation  THERAPY DIAG:  Chronic pain of both knees  ONSET DATE: 01/03/23 (referral date); symptoms chronic x 1 year   SUBJECTIVE:                                                                                                                                                                                           SUBJECTIVE STATEMENT: Pt c/o bil knee pain.  She reports her Rt knee has bothered her since her surgery in 2020,  and now she is favoring her other knee which is also hurting her.  She had bil cortisone injections about a month ago which helped, but pain is still present.  PERTINENT HISTORY:  HTN, bil OA, hx stomach ulcers  PAIN:  Are you having pain? Yes: NPRS scale: 5 currently, up to 7; at best 3/10 Pain location: bil knees (Rt> Lt) Pain description: tingling Aggravating factors: unsure Relieving factors: Ibuprofen 800 mg  PRECAUTIONS: None  WEIGHT BEARING RESTRICTIONS: No  FALLS:  Has patient fallen in last 6 months? 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)  PLOF: Independent and Leisure: go fishing ; Presenter, broadcasting, goes to gym 3x/wk (treadmill, rowing machine, knee extension, hamstring curls, hip addct/abdct)  PATIENT GOALS: improve pain   OBJECTIVE:   DIAGNOSTIC FINDINGS:  MRI previously of the right knee showed some mild arthritis but no meniscal tear  PATIENT SURVEYS:  02/02/23: FOTO 67 (predicted 74)  COGNITIVE STATUS: Within functional limits for tasks assessed    SENSATION: WFL  POSTURE:  rounded shoulders and forward head  GAIT: 02/02/23: Comments: independent with amb   Body Part #1 Knee   LOWER EXTREMITY ROM:     Active  Right eval Left eval  Knee flexion 122 126  Knee extension 4 7   (Blank rows = not tested)   LOWER EXTREMITY MMT:    MMT Right eval Left eval  Hip flexion    Hip extension 4/5 4/5  Hip abduction 5/5 5/5  Hip adduction    Hip internal rotation    Hip external rotation    Knee flexion 4+/5 4+/5  Knee extension    Ankle dorsiflexion    Ankle plantarflexion    Ankle inversion    Ankle eversion     (Blank rows = not tested)    TREATMENT:                                                                                                                              DATE:   02/02/23  See HEP - performed trial reps with mod cues PRN for comprehension  Premod to Rt knee x 4 min with intensity to tolerance - instructed in home TENS unit to help with pain if needed   PATIENT EDUCATION:  Education details: HEP Person educated: Patient Education method: Programmer, multimedia, Facilities manager, and Handouts Education comprehension: verbalized understanding, returned demonstration, and needs further education  HOME EXERCISE PROGRAM: Access Code: [email or web address] URL: [email or web address] Date: 02/02/2023 Prepared by: Moshe Cipro  Exercises - Supine Bridge  - 1-2 x daily - 7 x weekly - 2 sets - 10 reps - 5 sec hold - Sidelying Hip Circles  - 1-2 x daily - 7 x weekly - 2 sets - 10 reps - Seated Straight Leg Raise  - 1-2 x daily - 7 x weekly - 2 sets - 10 reps - Seated Long Arc Quad  - 1-2 x daily - 7 x weekly - 2 sets - 10 reps  Patient Education - TENS Unit   ASSESSMENT:  CLINICAL IMPRESSION: Patient is a 62 y.o. female who was seen today for physical therapy evaluation and treatment for bil knee pain.  She demonstrates mild strength deficits and continued pain affecting functional mobility.  She is already very active so provided HEP to perform on days she doesn't go to the gym and trial of estim for pain management.  Pt to call if additional appts are needed.  OBJECTIVE IMPAIRMENTS: decreased mobility, difficulty walking, decreased strength, and pain.   ACTIVITY LIMITATIONS: carrying, bending, sitting, standing, squatting, and locomotion level  PARTICIPATION LIMITATIONS: cleaning, laundry, community activity, occupation, and yard work  PERSONAL FACTORS: 3+ comorbidities: HTN, bil OA, hx stomach ulcers  are also affecting patient's functional outcome.   REHAB POTENTIAL: Good  CLINICAL DECISION MAKING: Stable/uncomplicated  EVALUATION COMPLEXITY: Low   GOALS: Goals reviewed with patient? Yes   LONG TERM GOALS: Target date:  03/02/2023  Independent with final HEP Goal status: INITIAL  2.  FOTO score improved to 74 Goal status: INITIAL  3.  Bil LE strength improved to 5/5 for improved strength Goal status: INIITAL  4.  Report pain < 3/10 with standing and walking for improved function Goal status: INITIAL   PLAN:  PT FREQUENCY: 1x/week   PT DURATION: 4 weeks  PLANNED INTERVENTIONS: Therapeutic exercises, Therapeutic activity, Patient/Family education, Self Care, Joint mobilization, Dry Needling, Electrical stimulation, Cryotherapy, Moist heat, Taping, Vasopneumatic device, Ultrasound, Manual therapy, and Re-evaluation.  PLAN FOR NEXT SESSION: If pt returns, review/update HEP, instruct in home TENS application  NEXT MD VISIT: 02/13/23   Clarita Crane, PT, DPT 02/02/23 9:52 AM    PHYSICAL THERAPY DISCHARGE SUMMARY  Visits from Start of Care: 1  Current functional level related to goals / functional outcomes: See above   Remaining deficits: See above   Education / Equipment: HEP   Patient agrees to discharge. Patient goals were not met. Patient is being discharged due to not returning since the last visit.  Clarita Crane, PT, DPT 03/15/23 1:05 PM  [facility] [address] [place], [place], [address] Phone: [phone]   Fax:  [phone]

## 2023-02-08 ENCOUNTER — Telehealth: Payer: Self-pay

## 2023-02-08 NOTE — Telephone Encounter (Signed)
PA has been submitted for Durolane, bilateral knee. PA pending  Called patient to verify primary insurance, but no answer and not able to leave a message.

## 2023-02-10 ENCOUNTER — Other Ambulatory Visit: Payer: Self-pay

## 2023-02-10 DIAGNOSIS — G8929 Other chronic pain: Secondary | ICD-10-CM

## 2023-02-13 ENCOUNTER — Encounter: Payer: Self-pay | Admitting: Orthopaedic Surgery

## 2023-02-13 ENCOUNTER — Ambulatory Visit (INDEPENDENT_AMBULATORY_CARE_PROVIDER_SITE_OTHER): Payer: Commercial Managed Care - HMO | Admitting: Orthopaedic Surgery

## 2023-02-13 DIAGNOSIS — M1711 Unilateral primary osteoarthritis, right knee: Secondary | ICD-10-CM

## 2023-02-13 DIAGNOSIS — G8929 Other chronic pain: Secondary | ICD-10-CM

## 2023-02-13 DIAGNOSIS — M25562 Pain in left knee: Secondary | ICD-10-CM

## 2023-02-13 DIAGNOSIS — M25561 Pain in right knee: Secondary | ICD-10-CM

## 2023-02-13 DIAGNOSIS — M1712 Unilateral primary osteoarthritis, left knee: Secondary | ICD-10-CM

## 2023-02-13 MED ORDER — LIDOCAINE HCL 1 % IJ SOLN
3.0000 mL | INTRAMUSCULAR | Status: AC | PRN
  Administered 2023-02-13: 3 mL

## 2023-02-13 MED ORDER — SODIUM HYALURONATE 60 MG/3ML IX PRSY
60.0000 mg | PREFILLED_SYRINGE | INTRA_ARTICULAR | Status: AC | PRN
  Administered 2023-02-13: 60 mg via INTRA_ARTICULAR

## 2023-02-13 NOTE — Progress Notes (Signed)
   Procedure Note  Patient: Tasha Fox             Date of Birth: October 01, 1960           MRN: [id number]             Visit Date: 02/13/2023  Procedures: Visit Diagnoses:  1. Chronic pain of right knee   2. Chronic pain of left knee     Large Joint Inj: R knee on 02/13/2023 9:06 AM Indications: diagnostic evaluation and pain Details: 22 G 1.5 in needle, superolateral approach  Arthrogram: No  Medications: 3 mL lidocaine 1 %; 60 mg Sodium Hyaluronate 60 MG/3ML Outcome: tolerated well, no immediate complications Procedure, treatment alternatives, risks and benefits explained, specific risks discussed. Consent was given by the patient. Immediately prior to procedure a time out was called to verify the correct patient, procedure, equipment, support staff and site/side marked as required. Patient was prepped and draped in the usual sterile fashion.    Large Joint Inj: L knee on 02/13/2023 9:06 AM Indications: diagnostic evaluation and pain Details: 22 G 1.5 in needle, superolateral approach  Arthrogram: No  Medications: 3 mL lidocaine 1 % Outcome: tolerated well, no immediate complications Procedure, treatment alternatives, risks and benefits explained, specific risks discussed. Consent was given by the patient. Immediately prior to procedure a time out was called to verify the correct patient, procedure, equipment, support staff and site/side marked as required. Patient was prepped and draped in the usual sterile fashion.    The patient comes in today with chronic pain in both of her knees and is scheduled for hyaluronic acid injections in both knees with Durolane.  She has had at least 2 remote surgeries on the right knee due to her current cyst at the medial aspect of the knee.  One of the surgeries was done by one of my colleagues in town and I performed the other surgery.  She hurts in that area today.  On exam neither knee has an effusion.  I do not feel any recurrence of  the cyst on the medial aspect of her knee.  There is no knee joint effusion of either knee so I did place Durolane to both knees which she tolerated well and also placed a steroid injection over the area of tenderness from her previous cyst excision on the medial aspect of the knee.  All questions and concerns were answered and addressed.  At this point follow-up is as needed.  Lot number: [id number]

## 2023-03-16 ENCOUNTER — Ambulatory Visit: Payer: Commercial Managed Care - HMO | Admitting: Cardiology

## 2023-03-16 ENCOUNTER — Ambulatory Visit: Payer: Commercial Managed Care - HMO | Admitting: Internal Medicine

## 2023-03-16 ENCOUNTER — Encounter: Payer: Self-pay | Admitting: Cardiology

## 2023-03-16 VITALS — BP 153/87 | HR 97 | Ht 64.0 in | Wt 152.8 lb

## 2023-03-16 DIAGNOSIS — K219 Gastro-esophageal reflux disease without esophagitis: Secondary | ICD-10-CM

## 2023-03-16 DIAGNOSIS — I10 Essential (primary) hypertension: Secondary | ICD-10-CM

## 2023-03-16 DIAGNOSIS — E7800 Pure hypercholesterolemia, unspecified: Secondary | ICD-10-CM

## 2023-03-16 MED ORDER — AMLODIPINE BESYLATE-VALSARTAN 5-160 MG PO TABS: 1.0000 | ORAL_TABLET | Freq: Every day | ORAL | 3 refills | Status: DC

## 2023-03-16 MED ORDER — EZETIMIBE 10 MG PO TABS: 10.0000 mg | ORAL_TABLET | Freq: Every day | ORAL | 3 refills | Status: DC

## 2023-03-16 NOTE — Patient Instructions (Addendum)
Please go to any [facility], after 6 hours of fasting to get blood work done about 3 to 4 days prior to your next office visit with me in 6 to 8 weeks.  I will

## 2023-03-16 NOTE — Progress Notes (Signed)
Primary Physician/Referring:  Karie Georges, MD  Patient ID: Tasha Fox, female    DOB: November 20, 1960, 62 y.o.   MRN: [id number]  Chief Complaint  Patient presents with   Hypertension   Follow-up   HPI:    Tasha Fox  is a 62 y.o. African-American female patient with primary hypertension, hypercholesterolemia, 70-pack-year history of smoking and has remained abstinent, GERD presents here for follow-up of chest pain.  Remains asymptomatic.  States that she feels well.  Unfortunately has discontinued all her antihypertensive medications.  Past Medical History:  Diagnosis Date   Chronic lower back pain    GERD (gastroesophageal reflux disease)    History of stomach ulcers    Past Surgical History:  Procedure Laterality Date   KNEE ARTHROSCOPY Right    UPPER GASTROINTESTINAL ENDOSCOPY  10/11/2022   Family History  Problem Relation Age of Onset   Diabetes Mother    Hypertension Brother    Stomach cancer Neg Hx    Esophageal cancer Neg Hx    Colon cancer Neg Hx     Social History   Tobacco Use   Smoking status: Former    Packs/day: 2.00    Years: 35.00    Additional pack years: 0.00    Total pack years: 70.00    Types: Cigarettes   Smokeless tobacco: Never  Substance Use Topics   Alcohol use: Yes    Comment: a few beers per week   Marital Status: Single  ROS  Review of Systems  Cardiovascular:  Negative for chest pain, dyspnea on exertion and leg swelling.   Objective      03/16/2023    8:57 AM 01/13/2023   10:48 AM 12/07/2022   10:45 AM  Vitals with BMI  Height 5\' 4"  5\' 4"    Weight 152 lbs 13 oz 155 lbs   BMI 26.22 26.59   Systolic 153 136 409  Diastolic 87 80 78  Pulse 97 89    Blood pressure (!) 153/87, pulse 97, height 5\' 4"  (1.626 m), weight 152 lb 12.8 oz (69.3 kg), SpO2 98 %.  Physical Exam Neck:     Vascular: No carotid bruit or JVD.  Cardiovascular:     Rate and Rhythm: Normal rate and regular rhythm.     Pulses:  Intact distal pulses.     Heart sounds: Normal heart sounds. No murmur heard.    No gallop.  Pulmonary:     Effort: Pulmonary effort is normal.     Breath sounds: Normal breath sounds.  Abdominal:     General: Bowel sounds are normal.     Palpations: Abdomen is soft.  Musculoskeletal:     Right lower leg: No edema.     Left lower leg: No edema.     Laboratory examination:   Recent Labs    06/12/22 1331 06/14/22 1847 12/07/22 1132  NA 138 137 138  K 3.7 3.5 3.7  CL 104 101 101  CO2 24 25 30   GLUCOSE 103* 105* 117*  BUN 10 10 11   CREATININE 0.90 0.67 0.84  CALCIUM 9.7 9.5 9.8  GFRNONAA >60 >60  --     Lab Results  Component Value Date   GLUCOSE 117 (H) 12/07/2022   NA 138 12/07/2022   K 3.7 12/07/2022   CL 101 12/07/2022   CO2 30 12/07/2022   BUN 11 12/07/2022   CREATININE 0.84 12/07/2022   GFRNONAA >60 06/14/2022   CALCIUM 9.8 12/07/2022   PROT 7.5 12/07/2022  ALBUMIN 4.3 12/07/2022   BILITOT 0.4 12/07/2022   ALKPHOS 95 12/07/2022   AST 22 12/07/2022   ALT 23 12/07/2022   ANIONGAP 11 06/14/2022      Lab Results  Component Value Date   ALT 23 12/07/2022   AST 22 12/07/2022   ALKPHOS 95 12/07/2022   BILITOT 0.4 12/07/2022       Latest Ref Rng & Units 06/14/2022    6:47 PM 06/12/2022    1:31 PM 01/29/2021   11:21 AM  CBC  WBC 4.0 - 10.5 K/uL 11.9  11.0  11.5   Hemoglobin 12.0 - 15.0 g/dL 86.5  78.4  69.6   Hematocrit 36.0 - 46.0 % 42.8  42.5  41.4   Platelets 150 - 400 K/uL 344  338  331        Latest Ref Rng & Units 12/07/2022   11:32 AM 06/14/2022    6:47 PM 06/12/2022    1:31 PM  Hepatic Function  Total Protein 6.0 - 8.3 g/dL 7.5  7.5  7.5   Albumin 3.5 - 5.2 g/dL 4.3  4.3  4.2   AST 0 - 37 U/L 22  29  35   ALT 0 - 35 U/L 23  18  17    Alk Phosphatase 39 - 117 U/L 95  80  85   Total Bilirubin 0.2 - 1.2 mg/dL 0.4  0.7  0.2    Lipid Panel Recent Labs    07/08/22 1148 12/07/22 1132  CHOL 311* 244*  TRIG 57.0 121.0  LDLCALC 195* 134*   VLDL 11.4 24.2  HDL 104.90 29.52  CHOLHDL 3 3    HEMOGLOBIN A1C Lab Results  Component Value Date   HGBA1C 5.3 07/08/2022   No results found for: "TSH"   Radiology:    Cardiac Studies:   PCV ECHOCARDIOGRAM COMPLETE 07/19/2022  Narrative Echocardiogram 07/19/2022: Normal LV systolic function with visual EF 60-65%. Left ventricle cavity is normal in size. Normal left ventricular wall thickness. Normal global wall motion. Normal diastolic filling pattern, normal LAP. Calculated EF 78%. Structurally normal mitral valve.  Mild (Grade I) mitral regurgitation. Structurally normal tricuspid valve with trace regurgitation. No evidence of pulmonary hypertension.     PCV MYOCARDIAL PERFUSION WO LEXISCAN 07/25/2022  Narrative Exercise nuclear stress test 07/25/2022: Myocardial perfusion is normal. Overall LV systolic function is normal without regional wall motion abnormalities. Stress LV EF: 70%. Normal ECG stress. The patient exercised for 8 minutes and 56 seconds of a Bruce protocol, achieving approximately 10.16 METs & 87% MPHR. The blood pressure response was normal. No previous exam available for comparison. Low risk.   EKG:   EKG 03/16/2023: Normal sinus rhythm at rate of 93 bpm, normal EKG.  Compared to 06/23/2022, no change.   Medications and allergies   Allergies  Allergen Reactions   Sulfa Antibiotics Swelling   Sulfamethoxazole-Trimethoprim Swelling     Medication list   Current Outpatient Medications:    amLODipine-valsartan (EXFORGE) 5-160 MG tablet, Take 1 tablet by mouth daily., Disp: 90 tablet, Rfl: 3   atorvastatin (LIPITOR) 40 MG tablet, Take 1 tablet (40 mg total) by mouth daily., Disp: 90 tablet, Rfl: 3   cyclobenzaprine (FLEXERIL) 10 MG tablet, Take 0.5-1 tablets (5-10 mg total) by mouth 3 (three) times daily as needed for muscle spasms., Disp: 30 tablet, Rfl: 5   ezetimibe (ZETIA) 10 MG tablet, Take 1 tablet (10 mg total) by mouth daily., Disp: 90  tablet, Rfl: 3   meloxicam (MOBIC) 15  MG tablet, Take 1 tablet (15 mg total) by mouth daily. (Patient taking differently: Take 15 mg by mouth daily as needed for pain.), Disp: 30 tablet, Rfl: 1   omeprazole (PRILOSEC) 40 MG capsule, Take 1 capsule (40 mg total) by mouth 2 (two) times daily., Disp: 60 capsule, Rfl: 3  Assessment     ICD-10-CM   1. Primary hypertension  I10 EKG 12-Lead    CANCELED: EKG 12-Lead    2. Pure hypercholesterolemia  E78.00 ezetimibe (ZETIA) 10 MG tablet    Lipid Panel With LDL/HDL Ratio    Lipoprotein A (LPA)    3. Gastroesophageal reflux disease without esophagitis  K21.9        Orders Placed This Encounter  Procedures   Lipid Panel With LDL/HDL Ratio   Lipoprotein A (LPA)   EKG 12-Lead    Meds ordered this encounter  Medications   amLODipine-valsartan (EXFORGE) 5-160 MG tablet    Sig: Take 1 tablet by mouth daily.    Dispense:  90 tablet    Refill:  3    Discontinue HCTZ, metoprolol, losartan   ezetimibe (ZETIA) 10 MG tablet    Sig: Take 1 tablet (10 mg total) by mouth daily.    Dispense:  90 tablet    Refill:  3    Medications Discontinued During This Encounter  Medication Reason   ibuprofen (ADVIL) 800 MG tablet    losartan (COZAAR) 25 MG tablet Change in therapy   hydrochlorothiazide (HYDRODIURIL) 12.5 MG tablet Patient has not taken in last 30 days   metoprolol succinate (TOPROL XL) 25 MG 24 hr tablet Patient has not taken in last 30 days     Recommendations:   Germain Benyo is a 62 y.o. African-American female patient with primary hypertension, hypercholesterolemia, 70-pack-year history of smoking and had remained abstinent but started smoking again couple months ago 4-5 cigarettes a day, GERD presents here for follow-up of chest pain.  1. Primary hypertension Patient with primary hypertension, she was on losartan and metoprolol however she has discontinued this.  She feels well and did not think she would need this.  I have  discussed with her regarding the risk of stroke and end-stage renal disease needing dialysis risk.  She is now willing to take medication.  To improve compliance, I have used Exforge 5/160 mg daily and will discontinue metoprolol and losartan and HCTZ.  She may need increased dose or she may need Exforge HCT dose to control her blood pressure but hopefully the combination of above will help.  - EKG 12-Lead  2. Pure hypercholesterolemia I reviewed her lipids, she has markedly elevated cholesterol, total cholesterol >300.  She is presently on 40 mg of Lipitor, LDL is still not at goal at <100.  I have added Zetia 10 mg daily.  She will obtain lipid profile testing in 6 to 8 weeks prior to her next office visit with me in 6 to 8 weeks.  - ezetimibe (ZETIA) 10 MG tablet; Take 1 tablet (10 mg total) by mouth daily.  Dispense: 90 tablet; Refill: 3 - Lipid Panel With LDL/HDL Ratio - Lipoprotein A (LPA)  3. Gastroesophageal reflux disease without esophagitis Patient's GERD symptoms are stable presently on PPI, she has started smoking again unfortunately.  She is smoking about 4 to 5 cigarettes a day, she is now willing to quit again.  Importance of complete abstinence discussed.  Other orders - amLODipine-valsartan (EXFORGE) 5-160 MG tablet; Take 1 tablet by mouth daily.  Dispense: 90  tablet; Refill: 3     Yates Decamp, MD, [facility] 03/16/2023, 9:25 AM Office: [phone]

## 2023-03-21 ENCOUNTER — Other Ambulatory Visit: Payer: Self-pay | Admitting: Family Medicine

## 2023-03-21 DIAGNOSIS — I10 Essential (primary) hypertension: Secondary | ICD-10-CM

## 2023-04-12 ENCOUNTER — Telehealth: Payer: Self-pay | Admitting: Orthopaedic Surgery

## 2023-04-12 DIAGNOSIS — G8929 Other chronic pain: Secondary | ICD-10-CM

## 2023-04-12 NOTE — Telephone Encounter (Signed)
Patient request a referral for a second op for her knee

## 2023-04-12 NOTE — Telephone Encounter (Signed)
Referral placed in chart. Called and advised pt

## 2023-05-08 ENCOUNTER — Encounter: Payer: Self-pay | Admitting: Cardiology

## 2023-05-08 ENCOUNTER — Ambulatory Visit: Payer: Commercial Managed Care - HMO | Admitting: Cardiology

## 2023-05-08 VITALS — BP 143/74 | HR 91 | Resp 16 | Ht 64.0 in | Wt 150.4 lb

## 2023-05-08 DIAGNOSIS — R0989 Other specified symptoms and signs involving the circulatory and respiratory systems: Secondary | ICD-10-CM

## 2023-05-08 DIAGNOSIS — I2510 Atherosclerotic heart disease of native coronary artery without angina pectoris: Secondary | ICD-10-CM

## 2023-05-08 DIAGNOSIS — I10 Essential (primary) hypertension: Secondary | ICD-10-CM

## 2023-05-08 DIAGNOSIS — Z720 Tobacco use: Secondary | ICD-10-CM

## 2023-05-08 DIAGNOSIS — E7800 Pure hypercholesterolemia, unspecified: Secondary | ICD-10-CM

## 2023-05-08 MED ORDER — EZETIMIBE 10 MG PO TABS: 10.0000 mg | ORAL_TABLET | Freq: Every evening | ORAL | 3 refills | Status: DC

## 2023-05-08 MED ORDER — ATORVASTATIN CALCIUM 40 MG PO TABS: 40.0000 mg | ORAL_TABLET | Freq: Every evening | ORAL | 3 refills | Status: DC

## 2023-05-08 MED ORDER — OLMESARTAN MEDOXOMIL 20 MG PO TABS: 20.0000 mg | ORAL_TABLET | Freq: Every evening | ORAL | 3 refills | Status: DC

## 2023-05-08 NOTE — Progress Notes (Unsigned)
Primary Physician/Referring:  Karie Georges, MD  Patient ID: Tasha Fox, female    DOB: January 23, 1961, 62 y.o.   MRN: [id number]  Chief Complaint  Patient presents with   Hypertension   Hyperlipidemia   Follow-up   HPI:    Tasha Fox  is a 62 y.o. African-American female patient with primary hypertension, hypercholesterolemia, 70-pack-year history of smoking and has remained abstinent, GERD presents here for follow-up of chest pain.  Remains asymptomatic.  States that she feels well.  Unfortunately has discontinued all her antihypertensive medications.  Past Medical History:  Diagnosis Date   Chronic lower back pain    GERD (gastroesophageal reflux disease)    History of stomach ulcers    Past Surgical History:  Procedure Laterality Date   KNEE ARTHROSCOPY Right    UPPER GASTROINTESTINAL ENDOSCOPY  10/11/2022   Family History  Problem Relation Age of Onset   Diabetes Mother    Hypertension Brother    Stomach cancer Neg Hx    Esophageal cancer Neg Hx    Colon cancer Neg Hx     Social History   Tobacco Use   Smoking status: Some Days    Current packs/day: 2.00    Average packs/day: 2.0 packs/day for 35.0 years (70.0 ttl pk-yrs)    Types: Cigarettes   Smokeless tobacco: Never  Substance Use Topics   Alcohol use: Yes    Comment: a few beers per week   Marital Status: Single  ROS  Review of Systems  Cardiovascular:  Negative for chest pain, dyspnea on exertion and leg swelling.   Objective      05/08/2023   11:29 AM 03/16/2023    8:57 AM 01/13/2023   10:48 AM  Vitals with BMI  Height 5\' 4"  5\' 4"  5\' 4"   Weight 150 lbs 6 oz 152 lbs 13 oz 155 lbs  BMI 25.8 26.22 26.59  Systolic 143 153 829  Diastolic 74 87 80  Pulse 91 97 89   Blood pressure (!) 143/74, pulse 91, resp. rate 16, height 5\' 4"  (1.626 m), weight 150 lb 6.4 oz (68.2 kg), SpO2 99%.  Physical Exam Neck:     Vascular: Carotid bruit (left) present. No JVD.  Cardiovascular:      Rate and Rhythm: Normal rate and regular rhythm.     Pulses: Intact distal pulses.     Heart sounds: Normal heart sounds. No murmur heard.    No gallop.  Pulmonary:     Effort: Pulmonary effort is normal.     Breath sounds: Normal breath sounds.  Abdominal:     General: Bowel sounds are normal.     Palpations: Abdomen is soft.  Musculoskeletal:     Right lower leg: No edema.     Left lower leg: No edema.     Laboratory examination:   Recent Labs    06/12/22 1331 06/14/22 1847 12/07/22 1132  NA 138 137 138  K 3.7 3.5 3.7  CL 104 101 101  CO2 24 25 30   GLUCOSE 103* 105* 117*  BUN 10 10 11   CREATININE 0.90 0.67 0.84  CALCIUM 9.7 9.5 9.8  GFRNONAA >60 >60  --     Lab Results  Component Value Date   GLUCOSE 117 (H) 12/07/2022   NA 138 12/07/2022   K 3.7 12/07/2022   CL 101 12/07/2022   CO2 30 12/07/2022   BUN 11 12/07/2022   CREATININE 0.84 12/07/2022   GFRNONAA >60 06/14/2022   CALCIUM 9.8  12/07/2022   PROT 7.5 12/07/2022   ALBUMIN 4.3 12/07/2022   BILITOT 0.4 12/07/2022   ALKPHOS 95 12/07/2022   AST 22 12/07/2022   ALT 23 12/07/2022   ANIONGAP 11 06/14/2022      Lab Results  Component Value Date   ALT 23 12/07/2022   AST 22 12/07/2022   ALKPHOS 95 12/07/2022   BILITOT 0.4 12/07/2022       Latest Ref Rng & Units 06/14/2022    6:47 PM 06/12/2022    1:31 PM 01/29/2021   11:21 AM  CBC  WBC 4.0 - 10.5 K/uL 11.9  11.0  11.5   Hemoglobin 12.0 - 15.0 g/dL 62.9  52.8  41.3   Hematocrit 36.0 - 46.0 % 42.8  42.5  41.4   Platelets 150 - 400 K/uL 344  338  331        Latest Ref Rng & Units 12/07/2022   11:32 AM 06/14/2022    6:47 PM 06/12/2022    1:31 PM  Hepatic Function  Total Protein 6.0 - 8.3 g/dL 7.5  7.5  7.5   Albumin 3.5 - 5.2 g/dL 4.3  4.3  4.2   AST 0 - 37 U/L 22  29  35   ALT 0 - 35 U/L 23  18  17    Alk Phosphatase 39 - 117 U/L 95  80  85   Total Bilirubin 0.2 - 1.2 mg/dL 0.4  0.7  0.2    Lipid Panel Recent Labs    07/08/22 1148  12/07/22 1132 04/28/23 0910  CHOL 311* 244* 304*  TRIG 57.0 121.0 95  LDLCALC 195* 134* 212*  VLDL 11.4 24.2  --   HDL 104.90 85.20 76  CHOLHDL 3 3  --     HEMOGLOBIN A1C Lab Results  Component Value Date   HGBA1C 5.3 07/08/2022   No results found for: "TSH"   Radiology:   CT angio chest 06/12/22: Cardiovascular: Preferential opacification of the thoracic aorta. No evidence of thoracic aortic aneurysm or dissection.  Cardiac Studies:   PCV ECHOCARDIOGRAM COMPLETE 07/19/2022  Narrative Echocardiogram 07/19/2022: Normal LV systolic function with visual EF 60-65%. Left ventricle cavity is normal in size. Normal left ventricular wall thickness. Normal global wall motion. Normal diastolic filling pattern, normal LAP. Calculated EF 78%. Structurally normal mitral valve.  Mild (Grade I) mitral regurgitation. Structurally normal tricuspid valve with trace regurgitation. No evidence of pulmonary hypertension.     PCV MYOCARDIAL PERFUSION WO LEXISCAN 07/25/2022  Narrative Exercise nuclear stress test 07/25/2022: Myocardial perfusion is normal. Overall LV systolic function is normal without regional wall motion abnormalities. Stress LV EF: 70%. Normal ECG stress. The patient exercised for 8 minutes and 56 seconds of a Bruce protocol, achieving approximately 10.16 METs & 87% MPHR. The blood pressure response was normal. No previous exam available for comparison. Low risk.   EKG:   EKG 03/16/2023: Normal sinus rhythm at rate of 93 bpm, normal EKG.  Compared to 06/23/2022, no change.   Medications and allergies   Allergies  Allergen Reactions   Sulfa Antibiotics Swelling   Sulfamethoxazole-Trimethoprim Swelling     Medication list   Current Outpatient Medications:    atorvastatin (LIPITOR) 40 MG tablet, Take 1 tablet (40 mg total) by mouth every evening., Disp: 90 tablet, Rfl: 3   ezetimibe (ZETIA) 10 MG tablet, Take 1 tablet (10 mg total) by mouth every evening., Disp: 90  tablet, Rfl: 3   olmesartan (BENICAR) 20 MG tablet, Take 1 tablet (20  mg total) by mouth every evening., Disp: 90 tablet, Rfl: 3   omeprazole (PRILOSEC) 40 MG capsule, Take 1 capsule (40 mg total) by mouth 2 (two) times daily., Disp: 60 capsule, Rfl: 3   cyclobenzaprine (FLEXERIL) 10 MG tablet, Take 0.5-1 tablets (5-10 mg total) by mouth 3 (three) times daily as needed for muscle spasms., Disp: 30 tablet, Rfl: 5  Assessment     ICD-10-CM   1. Primary hypertension  I10 olmesartan (BENICAR) 20 MG tablet    2. Pure hypercholesterolemia  E78.00 atorvastatin (LIPITOR) 40 MG tablet    ezetimibe (ZETIA) 10 MG tablet    Lipid Panel With LDL/HDL Ratio    3. Tobacco abuse  Z72.0     4. Left carotid bruit  R09.89 PCV CAROTID DUPLEX (BILATERAL)    5. Coronary artery calcification seen on CAT scan  I25.10 atorvastatin (LIPITOR) 40 MG tablet    ezetimibe (ZETIA) 10 MG tablet     Orders Placed This Encounter  Procedures   Lipid Panel With LDL/HDL Ratio    Standing Status:   Future    Standing Expiration Date:   05/07/2024   Meds ordered this encounter  Medications   atorvastatin (LIPITOR) 40 MG tablet    Sig: Take 1 tablet (40 mg total) by mouth every evening.    Dispense:  90 tablet    Refill:  3   ezetimibe (ZETIA) 10 MG tablet    Sig: Take 1 tablet (10 mg total) by mouth every evening.    Dispense:  90 tablet    Refill:  3   olmesartan (BENICAR) 20 MG tablet    Sig: Take 1 tablet (20 mg total) by mouth every evening.    Dispense:  90 tablet    Refill:  3   Medications Discontinued During This Encounter  Medication Reason   amLODipine-valsartan (EXFORGE) 5-160 MG tablet    atorvastatin (LIPITOR) 40 MG tablet    meloxicam (MOBIC) 15 MG tablet    ezetimibe (ZETIA) 10 MG tablet       Recommendations:   Tasha Fox is a 62 y.o. African-American female patient with primary hypertension, hypercholesterolemia, 70-pack-year history of smoking and had remained abstinent but  started smoking again couple months ago 4-5 cigarettes a day, GERD presents here for follow-up of hypertension and hypercholesterolemia.  1. Primary hypertension Patient's blood pressure is improved however she has discontinued all her medications, on her last office visit had started her on Exforge to simplify medical regimen in view of noncompliance.  I would like to start her on olmesartan 20 mg in the evening.  - olmesartan (BENICAR) 20 MG tablet; Take 1 tablet (20 mg total) by mouth every evening.  Dispense: 90 tablet; Refill: 3  2. Pure hypercholesterolemia She was also started on Zetia along with atorvastatin, she has discontinued both and LDL is now back to her baseline and total cholesterol being >300.  We discussed regarding primary prevention, coronary calcification noted on the CT scan and also she has a faint left carotid bruit.  She is now willing to start the statins again,  Restart atorvastatin 40 mg along with Zetia 10 mg in the evening.  Check lipids in 2 months. - atorvastatin (LIPITOR) 40 MG tablet; Take 1 tablet (40 mg total) by mouth every evening.  Dispense: 90 tablet; Refill: 3 - ezetimibe (ZETIA) 10 MG tablet; Take 1 tablet (10 mg total) by mouth every evening.  Dispense: 90 tablet; Refill: 3 - Lipid Panel With LDL/HDL Ratio; Future  3. Tobacco abuse She has reduced smoking however still smoking few, I have discussed regarding coronary calcification and aortic atherosclerosis.  4. Left carotid bruit She has a left carotid bruit that had missed on her last office visit, will check carotid duplex. - PCV CAROTID DUPLEX (BILATERAL); Future  5. Coronary artery calcification seen on CAT scan See that dictation above, will start her on atorvastatin and Zetia. - atorvastatin (LIPITOR) 40 MG tablet; Take 1 tablet (40 mg total) by mouth every evening.  Dispense: 90 tablet; Refill: 3 - ezetimibe (ZETIA) 10 MG tablet; Take 1 tablet (10 mg total) by mouth every evening.   Dispense: 90 tablet; Refill: 3    Yates Decamp, MD, [facility] 05/08/2023, 12:17 PM Office: [phone]

## 2023-06-09 ENCOUNTER — Other Ambulatory Visit: Payer: Commercial Managed Care - HMO

## 2023-07-03 ENCOUNTER — Telehealth: Payer: Self-pay | Admitting: Family Medicine

## 2023-07-03 NOTE — Telephone Encounter (Signed)
Pt passed away 07/14/2023 at 1am. Asking if PCP would be willing to sign death certificate. Last OV 12/31/2022. Asking for a call to confirm

## 2023-07-04 NOTE — Telephone Encounter (Signed)
I can do it-- how would I go about getting a copy of the EMS report?

## 2023-07-04 NOTE — Telephone Encounter (Signed)
I can sign it but I'm not sure what she passed away from-- is there any indication of what she died of? Was EMS called?

## 2023-07-04 NOTE — Telephone Encounter (Signed)
Spoke with Mikle Bosworth at the funeral home below and he stated he does not know the reason the patient passed, Dr Casimiro Needle is the patient's doctor and the EMS was called.  Message sent to PCP.

## 2023-07-04 NOTE — Telephone Encounter (Signed)
I called the [facility] non-emergency line at [phone] and spoke with Tasha Fox.  Tasha Fox stated to go on line for the county's website under public services for instructions to get a copy of the certificate.  I called [phone] and left a detailed message on the administrators line Tasha Fox) with the request per PCP as below.

## 2023-07-04 NOTE — Telephone Encounter (Signed)
[name] called back and stated they do not have a copy available and advised I call the Paramedic line at [phone].  I called this number, was transferred to a voicemail for [name] and information was left as PCP requests a copy of the EMS report.

## 2023-07-05 NOTE — Telephone Encounter (Signed)
[facility] In [place], [place] [phone].  Called to respectfully ask that MD please sign the death certificate (with cause of death), at her earliest convenience.  Stated they have been waiting several days now.

## 2023-07-05 NOTE — Telephone Encounter (Signed)
Noted  

## 2023-07-05 NOTE — Telephone Encounter (Signed)
Certificate completed ok to close

## 2023-07-11 NOTE — Telephone Encounter (Signed)
Tasha Fox called back, apologized for the delay as she has been out of the office.  Tasha Fox was informed the certificate has been completed.

## 2023-07-28 DEATH — deceased

## 2023-08-09 ENCOUNTER — Encounter: Payer: Commercial Managed Care - HMO | Admitting: Family Medicine

## 2023-08-17 ENCOUNTER — Ambulatory Visit: Payer: Self-pay | Admitting: Cardiology
